# Patient Record
Sex: Female | Born: 1955
Health system: Southern US, Community
[De-identification: ages and names within clinical notes are randomized; demographics above are authoritative.]

## PROBLEM LIST (undated history)

## (undated) DIAGNOSIS — Z8719 Personal history of other diseases of the digestive system: Secondary | ICD-10-CM

## (undated) DIAGNOSIS — K219 Gastro-esophageal reflux disease without esophagitis: Secondary | ICD-10-CM

## (undated) DIAGNOSIS — Z9009 Acquired absence of other part of head and neck: Secondary | ICD-10-CM

## (undated) HISTORY — PX: BREAST ENHANCEMENT SURGERY: SHX7

## (undated) HISTORY — PX: ABDOMINAL HYSTERECTOMY: SHX81

## (undated) HISTORY — DX: Personal history of other diseases of the digestive system: Z87.19

## (undated) HISTORY — DX: Gastro-esophageal reflux disease without esophagitis: K21.9

---
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
## 1998-03-18 ENCOUNTER — Other Ambulatory Visit: Admission: RE | Admit: 1998-03-18 | Discharge: 1998-03-18 | Payer: Self-pay | Admitting: *Deleted

## 2000-04-27 ENCOUNTER — Encounter: Payer: Self-pay | Admitting: Urology

## 2000-04-27 ENCOUNTER — Encounter: Admission: RE | Admit: 2000-04-27 | Discharge: 2000-04-27 | Payer: Self-pay | Admitting: Urology

## 2000-06-23 ENCOUNTER — Ambulatory Visit (HOSPITAL_COMMUNITY): Admission: RE | Admit: 2000-06-23 | Discharge: 2000-06-23 | Payer: Self-pay | Admitting: Urology

## 2000-07-01 ENCOUNTER — Encounter: Payer: Self-pay | Admitting: Neurology

## 2000-07-01 ENCOUNTER — Ambulatory Visit (HOSPITAL_COMMUNITY): Admission: RE | Admit: 2000-07-01 | Discharge: 2000-07-01 | Payer: Self-pay | Admitting: Neurology

## 2001-04-11 HISTORY — PX: INTERSTIM IMPLANT PLACEMENT: SHX5130

## 2001-08-01 ENCOUNTER — Encounter: Admission: RE | Admit: 2001-08-01 | Discharge: 2001-08-01 | Payer: Self-pay | Admitting: Family Medicine

## 2001-08-01 ENCOUNTER — Encounter: Payer: Self-pay | Admitting: Family Medicine

## 2002-05-27 ENCOUNTER — Encounter (INDEPENDENT_AMBULATORY_CARE_PROVIDER_SITE_OTHER): Payer: Self-pay | Admitting: *Deleted

## 2002-05-27 ENCOUNTER — Ambulatory Visit (HOSPITAL_COMMUNITY): Admission: RE | Admit: 2002-05-27 | Discharge: 2002-05-27 | Payer: Self-pay | Admitting: Gastroenterology

## 2002-08-23 ENCOUNTER — Encounter: Admission: RE | Admit: 2002-08-23 | Discharge: 2002-08-23 | Payer: Self-pay | Admitting: Family Medicine

## 2002-08-23 ENCOUNTER — Encounter: Payer: Self-pay | Admitting: Family Medicine

## 2003-06-25 ENCOUNTER — Other Ambulatory Visit: Admission: RE | Admit: 2003-06-25 | Discharge: 2003-06-25 | Payer: Self-pay | Admitting: Family Medicine

## 2003-11-26 ENCOUNTER — Ambulatory Visit (HOSPITAL_BASED_OUTPATIENT_CLINIC_OR_DEPARTMENT_OTHER): Admission: RE | Admit: 2003-11-26 | Discharge: 2003-11-26 | Payer: Self-pay | Admitting: Family Medicine

## 2004-11-16 ENCOUNTER — Other Ambulatory Visit: Admission: RE | Admit: 2004-11-16 | Discharge: 2004-11-16 | Payer: Self-pay | Admitting: Family Medicine

## 2005-10-28 ENCOUNTER — Encounter: Admission: RE | Admit: 2005-10-28 | Discharge: 2005-10-28 | Payer: Self-pay | Admitting: Family Medicine

## 2005-11-03 ENCOUNTER — Encounter: Admission: RE | Admit: 2005-11-03 | Discharge: 2005-11-03 | Payer: Self-pay | Admitting: Family Medicine

## 2005-11-21 ENCOUNTER — Other Ambulatory Visit: Admission: RE | Admit: 2005-11-21 | Discharge: 2005-11-21 | Payer: Self-pay | Admitting: Family Medicine

## 2006-12-07 ENCOUNTER — Other Ambulatory Visit: Admission: RE | Admit: 2006-12-07 | Discharge: 2006-12-07 | Payer: Self-pay | Admitting: Family Medicine

## 2007-04-12 DIAGNOSIS — E892 Postprocedural hypoparathyroidism: Secondary | ICD-10-CM

## 2007-04-12 HISTORY — DX: Postprocedural hypoparathyroidism: E89.2

## 2007-04-12 HISTORY — PX: PARATHYROIDECTOMY: SHX19

## 2007-10-06 ENCOUNTER — Inpatient Hospital Stay (HOSPITAL_COMMUNITY): Admission: EM | Admit: 2007-10-06 | Discharge: 2007-10-10 | Payer: Self-pay | Admitting: Emergency Medicine

## 2007-10-06 ENCOUNTER — Ambulatory Visit: Payer: Self-pay | Admitting: Internal Medicine

## 2007-10-25 ENCOUNTER — Encounter: Admission: RE | Admit: 2007-10-25 | Discharge: 2007-10-25 | Payer: Self-pay | Admitting: Family Medicine

## 2007-12-12 ENCOUNTER — Other Ambulatory Visit: Admission: RE | Admit: 2007-12-12 | Discharge: 2007-12-12 | Payer: Self-pay | Admitting: Family Medicine

## 2008-01-08 ENCOUNTER — Encounter (INDEPENDENT_AMBULATORY_CARE_PROVIDER_SITE_OTHER): Payer: Self-pay | Admitting: General Surgery

## 2008-01-08 ENCOUNTER — Ambulatory Visit (HOSPITAL_COMMUNITY): Admission: RE | Admit: 2008-01-08 | Discharge: 2008-01-09 | Payer: Self-pay | Admitting: General Surgery

## 2008-12-31 ENCOUNTER — Other Ambulatory Visit: Admission: RE | Admit: 2008-12-31 | Discharge: 2008-12-31 | Payer: Self-pay | Admitting: Family Medicine

## 2009-11-13 ENCOUNTER — Ambulatory Visit (HOSPITAL_BASED_OUTPATIENT_CLINIC_OR_DEPARTMENT_OTHER): Admission: RE | Admit: 2009-11-13 | Discharge: 2009-11-13 | Payer: Self-pay | Admitting: Urology

## 2010-02-15 ENCOUNTER — Other Ambulatory Visit: Admission: RE | Admit: 2010-02-15 | Discharge: 2010-02-15 | Payer: Self-pay | Admitting: Family Medicine

## 2010-05-03 ENCOUNTER — Encounter: Payer: Self-pay | Admitting: General Surgery

## 2010-06-25 LAB — POCT I-STAT 4, (NA,K, GLUC, HGB,HCT)
Glucose, Bld: 98 mg/dL (ref 70–99)
HCT: 42 % (ref 36.0–46.0)
Hemoglobin: 14.3 g/dL (ref 12.0–15.0)
Potassium: 4.3 mEq/L (ref 3.5–5.1)
Sodium: 141 mEq/L (ref 135–145)

## 2010-08-24 NOTE — H&P (Signed)
NAMETAHLOR, BERENGUER NO.:  [id number]   MEDICAL RECORD NO.:  [id number]          PATIENT TYPE:  EMS   LOCATION:  MAJO                         FACILITY:  [facility]   PHYSICIAN:  Donalynn Furlong, MD      DATE OF BIRTH:  07-29-1955   DATE OF ADMISSION:  10/05/2007  DATE OF DISCHARGE:                              HISTORY & PHYSICAL   PRIMARY CARE Mathea Frieling:  Donia Guiles, M.D. at [facility].   CHIEF COMPLAINT:  Abdominal pain.   HISTORY OF PRESENT ILLNESS:  Ms. Dore is a 55 year old lady who  lives with her husband in [address] [place].  She  presented to the ER tonight with her complaint of severe abdominal pain  which was located bilaterally in upper part of abdomen and also in right  lower quadrant.  She mentioned that abdominal pain started  intermittently around 2-3 days ago and it has persisted since last 3  days.  She had episodes of nausea over the last few days, but she  started having a couple of vomiting after eating fish sandwich today.  The pain got worse today which scared her and she thought she was having  a heart attack--that is why she came to the ER tonight.  She denied any  constipation, diarrhea, blood in the stool, black color stools.  She has  history of gastroesophageal reflux disease, but her abdominal pain does  not feel like heartburn.  It feels like sharp pain in the upper abdomen.  She denied any chest pain, shortness of breath, cough, sputum  production, headache, fever, urinary complaints, leg edema at this time.  She did mention she had some chills at home over the last 3 days.  She  denied any exotic food intake and nobody is sick around her.  She denied  any history of cholecystitis, appendicitis or cardiac issues in the  past.   PAST MEDICAL HISTORY:  1. Gastroesophageal reflux disease.  2. Interstitial cystitis.  3. History of hysterectomy.   ALLERGIES:  The patient mentions that PENICILLIN gives her  some nausea,  vomiting, but she has tolerated Augmentin and Keflex in the past.   HOME MEDICATIONS:  1. Aleve.  2. Protonix.   REVIEW OF SYSTEMS:  Positive as per HPI, otherwise negative review of  systems done for 14 systems.   FAMILY HISTORY:  Nothing remarkable for cancer or coronary artery  disease.   SOCIAL HISTORY:  She drinks 2 beers a day and occasionally mixed drinks.  She does not smoke.  She does not have a past history of smoking in the  past.  She never used any illicit drugs either.  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  She lives with her husband.   PHYSICAL EXAMINATION:  VITAL SIGNS:  Blood pressure 119/74, pulse 65,  respiration 18, temperature 98.3, oxygen saturation 98% on room air.  GENERAL:  Alert and oriented x3, lying in bed not in acute distress.  CARDIOVASCULAR:  S1 and S2, regular.  No murmur, rub or gallop.  LUNGS:  Clear to auscultation bilaterally.  No  wheezing or crackles.  ABDOMEN:  Tenderness over the right upper and lower quadrant and  epigastric region.  Tenderness is also located over the left upper  quadrant.  No rebound or rigidity noted.  No bilateral flank tenderness.  No distention.  No hepatosplenomegaly on exam.  RECTAL:  Stool occult was negative as per the ER physician.  EXTREMITIES:  No clubbing, cyanosis, edema.  Pulses 5/5 in all 4  extremities.  HEENT:  Head is normocephalic nontraumatic.  Eyes:  Pupils equal and  reactive to light and movement.  Oral cavity with clear mucus membranes,  no thrush noted.  NECK:  No thyromegaly or JVD.  SKIN:  No rash or bruits.  NEUROLOGIC:  Shows intact cranial nerves, muscular strength, sensation,  deep tendon reflexes.   LABORATORY DATA:  Lipase 117.  Stool occult blood negative.  Comprehensive metabolic panel unremarkable except glucose 100 and  calcium 14.1.  First set of cardiac enzymes normal.  We will check CBC  now also.   DIAGNOSTICS:  Abdominal x-ray shows no acute  abnormalities.   ASSESSMENT:  1. Nausea, vomiting, abdominal pain suspicious for pancreatitis,      appendicitis, cholecystitis versus gastroesophageal reflux disease.  2. History of gastroesophageal reflux disease.  3. History of interstitial cystitis.   PLAN:  We will admit the patient to [facility] Team on telemetry bed with  a diagnosis of abdominal pain.  The patient is full code.  We will check  CBC, CMP, magnesium phosphate in the morning.  We will check CBC now.  We will check CT abdomen/pelvis with and without contrast now for  abdominal pain.  We will start IV fluid normal saline at 75 cc/hour.  We  will keep her n.p.o. except waters and ice chips.  We will place SCDs on  the legs.  We will start her on IV Nexium 40 mg daily for prophylaxis.  We will check cardiac enzymes with troponin q.6 h. for 2 more sets.  Further plan according to the workup pending.      Donalynn Furlong, MD  Electronically Signed     TVP/MEDQ  D:  10/06/2007  T:  10/06/2007  Job:  [id number]   cc:   Donia Guiles, M.D.  Kela Millin, M.D.

## 2010-08-24 NOTE — Consult Note (Signed)
[name], [name] NO.:  [id number]   MEDICAL RECORD NO.:  [id number]          PATIENT TYPE:  INP   LOCATION:  4707                         FACILITY:  [facility]   PHYSICIAN:  Katherine Sportsman, MD     DATE OF BIRTH:  Aug 20, 1955   DATE OF CONSULTATION:  DATE OF DISCHARGE:                                 CONSULTATION   PRIMARY CARE PHYSICIAN:  Katherine Amen, MD.   REQUESTING PHYSICIAN:  Dr. Allena Jennings with [facility].   SURGEON:  Katherine Sportsman, MD.   REASON FOR CONSULTATION:  Upper abdominal pain, possible perforated  ulcer versus pancreatitis.   HISTORY OF PRESENT ILLNESS:  Katherine Jennings is a 55 year old female with  known gastroesophageal reflux disease that seemed relatively mild and  well controlled by PPIs.  She takes them on a daily basis, and sometimes  doubles them up.  She usually has discomfort to spicy and heavier rich  foods and tends to avoid that.   She notes after having a fish sandwich, which commonly has, she began to  have upper abdominal pain.  It seemed to be a little more right sided  than the left.  She became nauseated and threw up some bilious emesis,  without definite hematemesis.  She has also had noted that her bowel  movements have been a little more black and dark, but no frank  hematochezia.  They are not tarry or with coffee ground.  The patient  returned to the emergency room.  She had slightly elevated lipase, which  was concerned for pancreatitis, and therefore, the patient was admitted  to medicine for monitoring.  To Dr. Eliane Jennings examination, her pain seemed  to be more intense than pancreatitis, so he did an abdominal scan.  The  read noted some inflammation, but they could not rule out a contained  perforation.  Therefore, Dr. Allena Jennings requested surgical consultation to  evaluate.   The patient does drink a couple of beers at night, but she does not have  any abdominal discomfort with this and does note that she tends to  urinate more often than not.  She had a screening colonoscopy last year,  that was completely negative.  She denies any history of any prior  gastric ulcers.  She tells me she has an EGD done recently which showed  evidence of some reflux disease (? esophagitis), but no major ulcers or  masses that she can recall.   PAST MEDICAL HISTORY:  1. Gastroesophageal reflux disease.  2. Uterine dysplasia.  3. Interstitial cystitis for which she is followed by Katherine      Jennings.   PAST SURGICAL HISTORY:  She had a partial hysterectomy in the past.   ALLERGIES:  She is allergic to SULFA, PENICILLIN, and ERYTHROMYCIN.   MEDICATIONS:  1. She takes multivitamin with calcium and vitamin D daily.  2. She does take 600 calcium several times a day.  3. She takes Protonix 40 mg 1-2 times a day.  4. She takes Aleve for pain as well.   SOCIAL HISTORY:  Katherine Jennings drinks about 2 beers a day.  No tobacco or  other drug use.   FAMILY HISTORY:  Negative for any major pelvic or pancreatic disorders  that she can recall.   REVIEW OF SYSTEMS:  As per HPI.  Otherwise, not constitutional.  Ophthalmologic, ENT, cardiac, respiratory, hepatic, renal, and  endocrine, otherwise negative.  Musculoskeletal, neurologic, psych,  heme, and immunologic otherwise negative.  GYN as noted above.  No  recent vaginal bleeding or discharge.  No lower abdominal pain.  GU, she  has evidence of cystitis, but she does have an implant to help regulate.  She is followed by Katherine Jennings for this.  No major hematuria or  recent changes.   PHYSICAL EXAMINATION:  VITAL SIGNS:  Her temperature is 98.3, pulse is  65, respirations 18, blood pressure 119/74.  She has 98% sats on room  air.  GENERAL:  She is a well-developed, well-nourished, overweight female,  resting in no acute distress.  She is easily awoken, calm and relaxed.  PSYCH:  She has the least average intelligence.  No evidence of any  dementia, delirium,  psychosis, or paranoia.  EYES:  Pupils equal round and reactive to light.  Extraocular movements  are intact.  Sclerae are not icteric or injected.  She does wear  glasses.  NECK:  Supple without any masses.  I can feel her thyroid, and seems to  be in the normal range.  Trachea is midline.  HEENT:  She is normocephalic.  There is no facial asymmetry.  Mucous  membranes are moist.  Nasopharynx and oropharynx are clear.  CHEST:  Clear to auscultation bilaterally.  No wheezes, rales, or  rhonchi.  No pain to rib or sternal compression.  HEART:  Regular rate and rhythm.  No murmurs, clicks, or rubs.  LYMPH:  No neck, axillary, or groin lymphadenopathy.  ABDOMEN:  They are soft, slightly overweight but nondistended.  She has  mild abdominal discomfort to pain in her right upper quadrant epigastric  region, but no classic Murphy sign.  Her lower abdomen and left upper  quadrant seemed completely nontender to me.  She did not have any  evidence of any peritoneal signs to cough or bed shake or percussion.  GENITALIA:  Normal external female genitalia.  RECTAL:  Deferred given evaluation in the ER.  EXTREMITIES:  No clubbing, cyanosis, or edema.  MUSCULOSKELETAL:  Full range of motion in shoulders, elbows, wrists, as  well as hips, knees, and ankles.  BACK:  No pain on the cervical, thoracolumbosacral spine.  GAIT:  She does seem to have normal gait.   LABORATORY DATA:  Her lipase was 117.  Her liver function test and  electrolytes were normal.  Fecal occult blood is negative x1.  Hemoglobin is 11.1, white count 9.9.   Her calcium is 14.1.   CT scans of abdomen and pelvis are reviewed with radiology.  It does  show some stranding around the second and third parts of the duodenum  and head of the pancreas concerning for possibility of pancreatitis  versus duodenitis.  He does have a little thickening.  It is possible  that she could have an ulcer.  There is no doubts or fear to argue for  a  perforation or contained perforation in my mind, but I suppose that  could be a possibility at some point.  Her gallbladder looks normal.  Her liver, spleen, kidneys, and other solid organs look fine.  There is  no hydronephrosis.  No bowel obstruction.  There is no incisional  hernia.   ASSESSMENT AND PLAN:  A 55 year old female with paroxysms of abdominal  pain of uncertain etiology, most likely pancreatitis versus duodenitis.  1. I agree with hospital admission.  2. IV fluids and I am going to give her one more aggressive fluid      resuscitation right now.  3. Start on IV ciprofloxacin and Flagyl in case she does have evidence      of contained perforation, although I am little skeptical at this      time.  If her white count does not go up or she does not      deteriorate, we can follow her off the antibiotics.  4. Keep n.p.o. for now.  5. Try and obtain copies of EGD and colonoscopy done in the past.  6. I doubled her Protonix to b.i.d. for now.  7. Hypercalcemia of uncertain etiology.  That seems little bit higher      than just from oral ingestion alone.  I did not feel any palpable      mass in the thyroid, but if that persists, perhaps an ultrasound of      the neck is warranted to make sure that this not a primary etiology      since the calcium level of 14 is significantly high.  I would      definitely repeat labs to follow and then make sure that is      consistently high.  If it is, then she needs to stop her calcium      supplementation at this time, and perhaps continue workup with      urine, calcium, and phosphate, etc.  8. Probably abstain from alcohol ingestion for sometime until we can      sort out what was going on.  9. Ultrasound to make sure that she has not any gallstones that can be      contributing to her pancreatitis, although there is no strong      evidence at this time.  10.There is nothing surgically urgent at this time, so we will follow       expectantly.      Katherine Sportsman, MD  Electronically Signed     SCG/MEDQ  D:  10/06/2007  T:  10/06/2007  Job:  [id number]

## 2010-08-24 NOTE — Discharge Summary (Signed)
NAMENOAH, Katherine Jennings             ACCOUNT NO.:  [id number]   MEDICAL RECORD NO.:  [id number]          PATIENT TYPE:  INP   LOCATION:  3019                         FACILITY:  [facility]   PHYSICIAN:  Corinna L. Lendell Caprice, MDDATE OF BIRTH:  Feb 06, 1956   DATE OF ADMISSION:  10/05/2007  DATE OF DISCHARGE:  10/10/2007                               DISCHARGE SUMMARY   DISCHARGE DIAGNOSES:  1. Pancreatitis and duodenitis.  2. Hypercalcemia, intact PTH pending.  3. Gastroesophageal reflux disease.  4. Interstitial cystitis.  5. Hypokalemia.   DISCHARGE MEDICATIONS:  1. Dilaudid 2 mg every 4 hours as needed for pain.  2. Phenergan 12.5 to 25 mg p.o. q.6 h. p.r.n. nausea.  3. Stop calcium.  4. Continue Aleve as needed.  5. Protonix 40 mg a day.  6. Iron t.i.d.   CONDITION:  Stable.   CONSULTATION:  Ardeth Sportsman, MD   DIET:  Should be low fat for 2 weeks.  Avoid alcohol.   FOLLOWUP:  Follow up with Dr. Donia Guiles, at which time her intact  PTH results will need to be checked and a repeat calcium levels.  Follow  up with Dr. Carolynne Edouard in 2-3 weeks.   PROCEDURES:  None.   LABORATORY DATA:  Intact PTH is pending.  Ionized calcium on October 07, 2007, was 1.85, which is elevated.  CBC on admission showed a normal  white count, hemoglobin was 11, and hematocrit 31, otherwise  unremarkable.  Hemoccult of the stool was negative.  Basic metabolic  panel on admission was unremarkable, but her potassium dropped to 3.2  and was repleted.  Liver function tests unremarkable.  Lipase 117, total  protein 5.5, and albumin 3.5, otherwise normal LFTs.  Initial phosphorus  was 1.6 and after repletion was 3.0 and magnesium was normal.  At  discharge, her amylase and lipase were normal.  Triglyceride level 122.  Serial cardiac enzymes negative.   SPECIAL STUDIES/RADIOLOGY:  Acute abdominal series was unremarkable.  CT  of the abdomen and pelvis with contrast showed inflammatory change  around the second  and third portions of the duodenum and pancreatic head  with mild duodenal wall thickening.  No free air.  Consider duodenitis  and pancreatitis.  Ultrasound of the abdomen showed ill-defined  hypoechoic area in the pancreatic head and uncinate process, mildly  prominent pancreatic duct measuring 2.4-mm gallbladder sludge.   HISTORY AND HOSPITAL COURSE:  [name] is a pleasant 55 year old  white female who takes calcium and vitamin D.  She has a history of  gastroesophageal reflux disease.  She drinks about 2-3 drinks a day.  She presented to the emergency room with complaints of upper abdominal  pain for several days and nausea.  She also had vomiting the day of  admission.  She was afebrile and had normal vital signs and tenderness  in the upper abdomen without rebound or rigidity.  CAT scan and  ultrasound and labs were abnormal as above.  Surgery was consulted and  felt that this was not a perforated duodenal ulcer, but more likely  duodenitis/pancreatitis.  The patient was started on  empiric antibiotics  on admission and kept NPO.  She was noted to be hypercalcemic.  She does  take calcium and vitamin D.  An intact PTH is pending and will need to  be followed up as an outpatient.  The patient's symptoms improved.  Her  diet was advanced and she has been cleared by Surgery for discharge.      Corinna L. Lendell Caprice, MD  Electronically Signed     CLS/MEDQ  D:  12/05/2007  T:  12/06/2007  Job:  [id number]

## 2010-08-24 NOTE — Op Note (Signed)
Katherine Jennings, Katherine Jennings             ACCOUNT NO.:  [id number]   MEDICAL RECORD NO.:  [id number]          PATIENT TYPE:  OIB   LOCATION:  5120                         FACILITY:  [facility]   PHYSICIAN:  Ollen Gross. Vernell Morgans, M.D. DATE OF BIRTH:  1955-10-17   DATE OF PROCEDURE:  01/08/2008  DATE OF DISCHARGE:                               OPERATIVE REPORT   PREOPERATIVE DIAGNOSIS:  Right parathyroid adenoma.   POSTOPERATIVE DIAGNOSIS:  Right parathyroid adenoma x2.   PROCEDURE:  Right parathyroidectomy x2.   SURGEON:  Ollen Gross. Vernell Morgans, MD   ASSISTANT:  Velora Heckler, MD   ANESTHESIA:  General endotracheal.   PROCEDURE:  After informed consent was obtained, the patient was brought  to the operating room and placed in the supine position on the operating  table.  After adequate induction of general anesthesia, the patient's  neck was prepped with Betadine and draped in the usual sterile manner.  The patient had hypercalcemia and hyperparathyroidism.  She had a  sestamibi scan that localized the adenoma to the right side.  She could  not undergone an MRI study to determine whether this was superior-  inferior intrathyroidal.  A small transversely oriented incision was  made about 2 fingerbreadths above the clavicle on the right side of the  neck.  The patient had undergone earlier in the day a nuclear medicine  injection for sestamibi.  The patient seemed to have diffuse uptake  higher on the right side than on the left side.  It seemed to be a  little bit higher inferiorly than superiorly.  The midline was  identified and dissection was carried down sharply.  Once the incision  was made with 15 blade knife, the incision was carried down through the  skin, subcutaneous tissue, and platysma muscle sharply with  electrocautery.  Weitlaner retractor was deployed.  The midline was  identified and dissection was carried sharply with electrocautery down  the midline.  Until the thyroid gland was  identified, the strap muscles  were retracted laterally.  The lateral border of the thyroid gland was  gently rolled medially and anteriorly with a Kitner.  Blunt dissection  was carried out behind the thyroid gland.  Initially, there appeared to  be an abnormality inferiorly.  We had dissected the parathyroid gland  out by a combination of blunt right angle dissection and some sharp  dissection with electrocautery.  A couple of small vessels were  controlled with small vascular clips.  This inferior parathyroid gland  was enlarged consistent with an adenoma.  It was sent to pathology where  parathyroid adenoma was confirmed.  On further inspection, the superior  gland was also identified and after careful inspection also appeared to  be abnormally enlarged.  The superior parathyroid gland was also removed  by a combination of blunt right angle dissection and some sharp  dissection with electrocautery and controlled some small vessels with  small vascular clips.  Once this was removed, it was also sent to  pathology where parathyroid adenoma was confirmed.  There were no other  visible or palpable abnormalities on  the right side.  The area was  completely hemostatic.  The operative bed was irrigated and then lined  with some  Surgicel.  The strap muscles were then reapproximated with  interrupted 4-0 Vicryl stitches and the platysma muscle also was  reapproximated with interrupted 4-0 Vicryl stitches.  The skin was  then closed with running 4-0 Monocryl subcuticular stitch and a  Dermabond dressing was applied.  The patient tolerated the procedure  well.  At the end of the case, all needle, sponge, and instrument counts  were correct.  The patient was awakened and taken to the recovery room  in stable condition.      Ollen Gross. Vernell Morgans, M.D.  Electronically Signed     PST/MEDQ  D:  01/08/2008  T:  01/09/2008  Job:  [id number]

## 2010-08-27 NOTE — Op Note (Signed)
[facility]  Patient:    Katherine Jennings, Katherine Jennings                    MRN: [id number] Proc. Date: 06/23/00 Adm. Date:  [id number] Attending:  Nelma Rothman Iii                           Operative Report  PROCEDURES:  Cystourethroscopy, hydraulic bladder distention, bladder biopsy, and examination under anesthesia.  SURGEON:  Lucrezia Starch. Ovidio Hanger, M.D.  ANESTHESIA:  General laryngeal airway.  ESTIMATED BLOOD LOSS:  Negligible.  TUBES:  None.  COMPLICATIONS:  None.  INDICATION FOR PROCEDURE:  [name] is a lovely 55 year old white female who presented with severe urgency and frequency, which has been progressive. She has undergone urodynamic evaluation, which revealed several low-amplitude uninhibited bladder contractions with provocation.  She has been treated with anticholinergics and it has helped some, but the situation is still persistent.  She again has a worsening situation with leakage, primarily urge incontinence, has undergone a workup by Genene Churn. Love, M.D., neurologically, and is completing that workup, but we had some concerns of interstitial cystitis as certainly a possibility.  After examining the risks, benefits, and alternatives, she has elected to proceed with the above procedure.  DESCRIPTION OF PROCEDURE:  The patient was placed in the supine position and after proper general laryngeal airway anesthesia was placed in the dorsal lithotomy position and prepped and draped with Betadine in a sterile fashion. Cystourethroscopy was performed with a 22.5 [place] [employer] panendoscope utilizing the 30 and 70 degree lenses.  The bladder was carefully inspected and noted to be without lesion.  An efflux of clear urine was noted from the normally-placed ureteral orifices bilaterally, and the urethra appeared to be essentially normal without inflammation.  Hydraulic bladder distention was then performed to 80 cm H2O.  She had a capacity of 700  cc.  This was drained. and there were diffuse glomerulations noted throughout the bladder, and pictures were taken of that with the camera, but it was clear-cut glomerulations suspicious for interstitial cystitis.  A biopsy was then obtained of the posterior midline at an area of glomerulations and submitted to pathology, and the base was cauterized with Bovie coagulation cautery.  The bladder was drained.  The panendoscope was visually removed.  Exam under anesthesia revealed a benign, palpable bladder.  No masses were noted.  Of note, she did appear to have a tiny hemangioma inside the left labia minora. The patient tolerated the procedure well, specimens submitted to pathology, and she was taken to the recovery room stable. DD:  06/23/00 TD:  06/23/00 Job: [id number]

## 2010-08-27 NOTE — Procedures (Signed)
NAME:  Katherine Jennings, Katherine Jennings           ACCOUNT NO.:  [id number]   MEDICAL RECORD NO.:  [id number]          PATIENT TYPE:  OUT   LOCATION:  SLEEP CENTER                 FACILITY:  [facility]   PHYSICIAN:  Clinton D. Maple Hudson, M.D. DATE OF BIRTH:  11-17-1955   DATE OF ADMISSION:  11/26/2003  DATE OF DISCHARGE:  11/26/2003                              NOCTURNAL POLYSOMNOGRAM   REFERRING PHYSICIAN:  Dr. Donia Guiles   INDICATION FOR STUDY:  Hypersomnia with sleep apnea, complaints of loud  snoring, witnessed apnea, frequent waking after sleep onset.   EPWORTH SLEEPINESS SCORE:  6/24   BODY MASS INDEX:  26.6   WEIGHT:  170 pounds   MEDICATION:  Elmiron, black cohosh, Caltrate, hydroxyzine, Zoloft, Aleve,  Prilosec, Atarax.  Patient took Zoloft and hydroxyzine prior to bedtime on  study night.   SLEEP ARCHITECTURE:  Total sleep time 357 minutes with sleep efficiency 84%.  Stage I was 9%, Stage II 62%, Stages III and IV 8%, REM was 21% of total  sleep time, latency to sleep onset 22 minutes, latency to REM 186 minutes.  Awake after sleep onset 47 minutes.  Arousal index 14/hr.   RESPIRATORY DATA:  A total of six obstructive hypopneas and two obstructive  apneas were recorded for a respiratory index of 1.3/hr which is within  normal limits.  She did not qualify for split-study protocol.  Events were  primarily associated with supine sleep position.  REM RDI was 0.8/hr.   OXYGEN DATA:  Moderate snoring with normal oxygen saturation.  Mean oxygen  saturation through the study was 95% with nadir briefly recorded at 89%.   CARDIAC DATA:  Normal cardiac rhythm with no significant ectopic events.   MOVEMENT/PARASOMNIA:  A total of 37 limb jerks were recorded of which 5 were  associated with arousal or awakening for a periodic limb movement with  arousal index of 0.8/hr which is within normal limits.  Bruxism was  questioned.   IMPRESSION/RECOMMENDATION:  Occasional obstructive apneas and  hypopneas,  within normal limits with an respiratory disturbance index of 1.3/hr and  normal oxygenation.  Events appeared to be positional, most frequent while  she was sleeping supine.  Avoiding this sleep position may be of some  benefit.                                   ______________________________                                Rennis Chris. Maple Hudson, M.D.                                Diplomate, American Board of Sleep Medicine    CDY/MEDQ  D:  12/07/2003 09:00:36  T:  12/08/2003 08:37:55  Job:  [id number]

## 2011-01-06 LAB — CBC
HCT: 31 — ABNORMAL LOW
HCT: 31.2 — ABNORMAL LOW
HCT: 31.1 — ABNORMAL LOW
Hemoglobin: 10.9 — ABNORMAL LOW
Hemoglobin: 11.1 — ABNORMAL LOW
Hemoglobin: 10.9 — ABNORMAL LOW
MCHC: 34.8
MCHC: 35.9
MCHC: 34.9
MCV: 97
MCV: 97.5
MCV: 98.5
Platelets: 287
Platelets: 295
Platelets: 306
RBC: 3.2 — ABNORMAL LOW
RBC: 3.2 — ABNORMAL LOW
RBC: 3.16 — ABNORMAL LOW
RDW: 12.6
RDW: 12.6
RDW: 12.6
WBC: 9.2
WBC: 9.9
WBC: 6.7

## 2011-01-06 LAB — BASIC METABOLIC PANEL
BUN: 6
BUN: 6
BUN: 6
CO2: 25
CO2: 25
CO2: 24
Calcium: 11.2 — ABNORMAL HIGH
Calcium: 11.5 — ABNORMAL HIGH
Calcium: 11.7 — ABNORMAL HIGH
Chloride: 108
Chloride: 114 — ABNORMAL HIGH
Chloride: 112
Creatinine, Ser: 0.85
Creatinine, Ser: 0.87
Creatinine, Ser: 0.86
GFR calc Af Amer: >60
GFR calc Af Amer: >60
GFR calc Af Amer: >60
GFR calc non Af Amer: >60
GFR calc non Af Amer: >60
GFR calc non Af Amer: >60
Glucose, Bld: 86
Glucose, Bld: 91
Glucose, Bld: 98
Potassium: 3.2 — ABNORMAL LOW
Potassium: 3.2 — ABNORMAL LOW
Potassium: 3.8
Sodium: 140
Sodium: 141
Sodium: 141

## 2011-01-06 LAB — CARDIAC PANEL(CRET KIN+CKTOT+MB+TROPI)
CK, MB: 1
Relative Index: INVALID
Total CK: 44
Troponin I: 0.01

## 2011-01-06 LAB — COMPREHENSIVE METABOLIC PANEL
ALT: 22
ALT: 26
AST: 15
AST: 22
Albumin: 3.5
Albumin: 4.3
Alkaline Phosphatase: 69
Alkaline Phosphatase: 86
BUN: 11
BUN: 19
CO2: 24
CO2: 26
Calcium: 11.7 — ABNORMAL HIGH
Calcium: 14.1
Chloride: 111
Chloride: 111
Creatinine, Ser: 0.89
Creatinine, Ser: 1.05
GFR calc Af Amer: >60
GFR calc Af Amer: >60
GFR calc non Af Amer: 55 — ABNORMAL LOW
GFR calc non Af Amer: >60
Glucose, Bld: 100 — ABNORMAL HIGH
Glucose, Bld: 98
Potassium: 3.9
Potassium: 4.3
Sodium: 140
Sodium: 142
Total Bilirubin: 0.9
Total Bilirubin: 1
Total Protein: 5.5 — ABNORMAL LOW
Total Protein: 6.7

## 2011-01-06 LAB — LIPASE, BLOOD
Lipase: 117 — ABNORMAL HIGH
Lipase: 39
Lipase: 44
Lipase: 68 — ABNORMAL HIGH

## 2011-01-06 LAB — POCT CARDIAC MARKERS
CKMB, poc: <1 — ABNORMAL LOW
Myoglobin, poc: 62.1
Operator id: 192351
Troponin i, poc: <0.05

## 2011-01-06 LAB — PHOSPHORUS
Phosphorus: 1.5 — ABNORMAL LOW
Phosphorus: 1.6 — ABNORMAL LOW
Phosphorus: 3

## 2011-01-06 LAB — OCCULT BLOOD X 1 CARD TO LAB, STOOL: Fecal Occult Bld: NEGATIVE

## 2011-01-06 LAB — AMYLASE
Amylase: 106
Amylase: 38
Amylase: 45

## 2011-01-06 LAB — CALCIUM, IONIZED: Calcium, Ion: 1.85 — ABNORMAL HIGH

## 2011-01-06 LAB — TROPONIN I: Troponin I: <0.01

## 2011-01-06 LAB — CK TOTAL AND CKMB (NOT AT ARMC)
CK, MB: 0.8
Relative Index: INVALID
Total CK: 44

## 2011-01-06 LAB — PTH, INTACT AND CALCIUM
Calcium, Total (PTH): 12.3 — ABNORMAL HIGH
PTH: 286 — ABNORMAL HIGH

## 2011-01-06 LAB — MAGNESIUM: Magnesium: 2

## 2011-01-06 LAB — TRIGLYCERIDES: Triglycerides: 122

## 2011-01-10 LAB — CBC
HCT: 37.4
Hemoglobin: 12.8
MCHC: 34.1
MCV: 98.9
Platelets: 381
RBC: 3.79 — ABNORMAL LOW
RDW: 12.4
WBC: 8

## 2011-01-10 LAB — BASIC METABOLIC PANEL
BUN: 19
CO2: 31
Calcium: 12.9 — ABNORMAL HIGH
Chloride: 106
Creatinine, Ser: 0.96
GFR calc Af Amer: >60
GFR calc non Af Amer: >60
Glucose, Bld: 90
Potassium: 4.2
Sodium: 140

## 2011-01-10 LAB — CALCIUM
Calcium: 10.8 — ABNORMAL HIGH
Calcium: 12 — ABNORMAL HIGH

## 2015-08-10 DIAGNOSIS — Z1231 Encounter for screening mammogram for malignant neoplasm of breast: Secondary | ICD-10-CM | POA: Diagnosis not present

## 2015-10-16 DIAGNOSIS — R350 Frequency of micturition: Secondary | ICD-10-CM | POA: Diagnosis not present

## 2015-10-16 DIAGNOSIS — N3010 Interstitial cystitis (chronic) without hematuria: Secondary | ICD-10-CM | POA: Diagnosis not present

## 2015-11-06 DIAGNOSIS — N3010 Interstitial cystitis (chronic) without hematuria: Secondary | ICD-10-CM | POA: Diagnosis not present

## 2015-11-06 DIAGNOSIS — T85113A Breakdown (mechanical) of implanted electronic neurostimulator, generator, initial encounter: Secondary | ICD-10-CM | POA: Diagnosis not present

## 2015-11-06 DIAGNOSIS — Z4542 Encounter for adjustment and management of neuropacemaker (brain) (peripheral nerve) (spinal cord): Secondary | ICD-10-CM | POA: Diagnosis not present

## 2015-11-17 DIAGNOSIS — N3010 Interstitial cystitis (chronic) without hematuria: Secondary | ICD-10-CM | POA: Diagnosis not present

## 2016-05-26 DIAGNOSIS — Z0000 Encounter for general adult medical examination without abnormal findings: Secondary | ICD-10-CM | POA: Diagnosis not present

## 2016-05-26 DIAGNOSIS — M8588 Other specified disorders of bone density and structure, other site: Secondary | ICD-10-CM | POA: Diagnosis not present

## 2016-05-26 DIAGNOSIS — D351 Benign neoplasm of parathyroid gland: Secondary | ICD-10-CM | POA: Diagnosis not present

## 2016-05-26 DIAGNOSIS — N3011 Interstitial cystitis (chronic) with hematuria: Secondary | ICD-10-CM | POA: Diagnosis not present

## 2016-05-26 DIAGNOSIS — K219 Gastro-esophageal reflux disease without esophagitis: Secondary | ICD-10-CM | POA: Diagnosis not present

## 2016-08-10 DIAGNOSIS — Z1231 Encounter for screening mammogram for malignant neoplasm of breast: Secondary | ICD-10-CM | POA: Diagnosis not present

## 2016-08-11 DIAGNOSIS — R922 Inconclusive mammogram: Secondary | ICD-10-CM | POA: Diagnosis not present

## 2016-08-19 DIAGNOSIS — Z1211 Encounter for screening for malignant neoplasm of colon: Secondary | ICD-10-CM | POA: Diagnosis not present

## 2016-08-19 DIAGNOSIS — D126 Benign neoplasm of colon, unspecified: Secondary | ICD-10-CM | POA: Diagnosis not present

## 2016-08-19 DIAGNOSIS — K6389 Other specified diseases of intestine: Secondary | ICD-10-CM | POA: Diagnosis not present

## 2016-08-23 DIAGNOSIS — Z1211 Encounter for screening for malignant neoplasm of colon: Secondary | ICD-10-CM | POA: Diagnosis not present

## 2016-08-23 DIAGNOSIS — K6389 Other specified diseases of intestine: Secondary | ICD-10-CM | POA: Diagnosis not present

## 2016-08-23 DIAGNOSIS — D126 Benign neoplasm of colon, unspecified: Secondary | ICD-10-CM | POA: Diagnosis not present

## 2016-09-15 DIAGNOSIS — K635 Polyp of colon: Secondary | ICD-10-CM | POA: Diagnosis not present

## 2016-11-25 DIAGNOSIS — M719 Bursopathy, unspecified: Secondary | ICD-10-CM | POA: Diagnosis not present

## 2016-11-25 DIAGNOSIS — K219 Gastro-esophageal reflux disease without esophagitis: Secondary | ICD-10-CM | POA: Diagnosis not present

## 2016-11-28 ENCOUNTER — Other Ambulatory Visit: Payer: Self-pay | Admitting: Physician Assistant

## 2016-11-28 DIAGNOSIS — R1310 Dysphagia, unspecified: Secondary | ICD-10-CM

## 2016-11-28 DIAGNOSIS — K219 Gastro-esophageal reflux disease without esophagitis: Secondary | ICD-10-CM

## 2016-11-29 DIAGNOSIS — M25552 Pain in left hip: Secondary | ICD-10-CM | POA: Diagnosis not present

## 2016-11-29 DIAGNOSIS — M25551 Pain in right hip: Secondary | ICD-10-CM | POA: Diagnosis not present

## 2016-11-29 DIAGNOSIS — M545 Low back pain: Secondary | ICD-10-CM | POA: Diagnosis not present

## 2016-11-30 ENCOUNTER — Ambulatory Visit
Admission: RE | Admit: 2016-11-30 | Discharge: 2016-11-30 | Disposition: A | Discharge status: Home / Self Care | Payer: BLUE CROSS/BLUE SHIELD | Source: Ambulatory Visit | Attending: Physician Assistant | Admitting: Physician Assistant

## 2016-11-30 DIAGNOSIS — R1310 Dysphagia, unspecified: Secondary | ICD-10-CM

## 2016-11-30 DIAGNOSIS — K219 Gastro-esophageal reflux disease without esophagitis: Secondary | ICD-10-CM | POA: Diagnosis not present

## 2016-12-02 DIAGNOSIS — K2930 Chronic superficial gastritis without bleeding: Secondary | ICD-10-CM | POA: Diagnosis not present

## 2016-12-02 DIAGNOSIS — R1310 Dysphagia, unspecified: Secondary | ICD-10-CM | POA: Diagnosis not present

## 2016-12-02 DIAGNOSIS — R933 Abnormal findings on diagnostic imaging of other parts of digestive tract: Secondary | ICD-10-CM | POA: Diagnosis not present

## 2016-12-07 DIAGNOSIS — K2930 Chronic superficial gastritis without bleeding: Secondary | ICD-10-CM | POA: Diagnosis not present

## 2016-12-15 DIAGNOSIS — D122 Benign neoplasm of ascending colon: Secondary | ICD-10-CM | POA: Diagnosis not present

## 2016-12-28 DIAGNOSIS — K219 Gastro-esophageal reflux disease without esophagitis: Secondary | ICD-10-CM | POA: Diagnosis not present

## 2016-12-28 DIAGNOSIS — Z79899 Other long term (current) drug therapy: Secondary | ICD-10-CM | POA: Diagnosis not present

## 2016-12-28 DIAGNOSIS — Z881 Allergy status to other antibiotic agents status: Secondary | ICD-10-CM | POA: Diagnosis not present

## 2016-12-28 DIAGNOSIS — K635 Polyp of colon: Secondary | ICD-10-CM | POA: Diagnosis not present

## 2016-12-28 DIAGNOSIS — Z880 Allergy status to penicillin: Secondary | ICD-10-CM | POA: Diagnosis not present

## 2016-12-28 DIAGNOSIS — K6389 Other specified diseases of intestine: Secondary | ICD-10-CM | POA: Diagnosis not present

## 2016-12-28 DIAGNOSIS — Z86010 Personal history of colonic polyps: Secondary | ICD-10-CM | POA: Diagnosis not present

## 2016-12-28 DIAGNOSIS — Z882 Allergy status to sulfonamides status: Secondary | ICD-10-CM | POA: Diagnosis not present

## 2017-01-05 DIAGNOSIS — N3010 Interstitial cystitis (chronic) without hematuria: Secondary | ICD-10-CM | POA: Diagnosis not present

## 2017-01-12 DIAGNOSIS — K635 Polyp of colon: Secondary | ICD-10-CM | POA: Diagnosis not present

## 2017-01-12 DIAGNOSIS — K219 Gastro-esophageal reflux disease without esophagitis: Secondary | ICD-10-CM | POA: Diagnosis not present

## 2017-01-12 DIAGNOSIS — R1310 Dysphagia, unspecified: Secondary | ICD-10-CM | POA: Diagnosis not present

## 2017-06-12 DIAGNOSIS — M7712 Lateral epicondylitis, left elbow: Secondary | ICD-10-CM | POA: Diagnosis not present

## 2017-06-22 DIAGNOSIS — M6281 Muscle weakness (generalized): Secondary | ICD-10-CM | POA: Diagnosis not present

## 2017-06-22 DIAGNOSIS — M25522 Pain in left elbow: Secondary | ICD-10-CM | POA: Diagnosis not present

## 2017-06-22 DIAGNOSIS — M7712 Lateral epicondylitis, left elbow: Secondary | ICD-10-CM | POA: Diagnosis not present

## 2017-06-23 DIAGNOSIS — Z0000 Encounter for general adult medical examination without abnormal findings: Secondary | ICD-10-CM | POA: Diagnosis not present

## 2017-06-23 DIAGNOSIS — K219 Gastro-esophageal reflux disease without esophagitis: Secondary | ICD-10-CM | POA: Diagnosis not present

## 2017-06-23 DIAGNOSIS — M8588 Other specified disorders of bone density and structure, other site: Secondary | ICD-10-CM | POA: Diagnosis not present

## 2017-06-28 DIAGNOSIS — M25522 Pain in left elbow: Secondary | ICD-10-CM | POA: Diagnosis not present

## 2017-06-28 DIAGNOSIS — M6281 Muscle weakness (generalized): Secondary | ICD-10-CM | POA: Diagnosis not present

## 2017-06-28 DIAGNOSIS — M7712 Lateral epicondylitis, left elbow: Secondary | ICD-10-CM | POA: Diagnosis not present

## 2017-06-30 DIAGNOSIS — M7712 Lateral epicondylitis, left elbow: Secondary | ICD-10-CM | POA: Diagnosis not present

## 2017-06-30 DIAGNOSIS — M25522 Pain in left elbow: Secondary | ICD-10-CM | POA: Diagnosis not present

## 2017-06-30 DIAGNOSIS — M6281 Muscle weakness (generalized): Secondary | ICD-10-CM | POA: Diagnosis not present

## 2017-07-05 DIAGNOSIS — M6281 Muscle weakness (generalized): Secondary | ICD-10-CM | POA: Diagnosis not present

## 2017-07-05 DIAGNOSIS — M7712 Lateral epicondylitis, left elbow: Secondary | ICD-10-CM | POA: Diagnosis not present

## 2017-07-05 DIAGNOSIS — M25522 Pain in left elbow: Secondary | ICD-10-CM | POA: Diagnosis not present

## 2017-07-07 DIAGNOSIS — M6281 Muscle weakness (generalized): Secondary | ICD-10-CM | POA: Diagnosis not present

## 2017-07-07 DIAGNOSIS — M25522 Pain in left elbow: Secondary | ICD-10-CM | POA: Diagnosis not present

## 2017-07-07 DIAGNOSIS — M7712 Lateral epicondylitis, left elbow: Secondary | ICD-10-CM | POA: Diagnosis not present

## 2017-07-11 DIAGNOSIS — M25522 Pain in left elbow: Secondary | ICD-10-CM | POA: Diagnosis not present

## 2017-07-11 DIAGNOSIS — M6281 Muscle weakness (generalized): Secondary | ICD-10-CM | POA: Diagnosis not present

## 2017-07-11 DIAGNOSIS — M7712 Lateral epicondylitis, left elbow: Secondary | ICD-10-CM | POA: Diagnosis not present

## 2017-07-14 DIAGNOSIS — M7712 Lateral epicondylitis, left elbow: Secondary | ICD-10-CM | POA: Diagnosis not present

## 2017-07-14 DIAGNOSIS — M25522 Pain in left elbow: Secondary | ICD-10-CM | POA: Diagnosis not present

## 2017-07-14 DIAGNOSIS — M6281 Muscle weakness (generalized): Secondary | ICD-10-CM | POA: Diagnosis not present

## 2017-07-17 DIAGNOSIS — M25522 Pain in left elbow: Secondary | ICD-10-CM | POA: Diagnosis not present

## 2017-08-15 DIAGNOSIS — Z1231 Encounter for screening mammogram for malignant neoplasm of breast: Secondary | ICD-10-CM | POA: Diagnosis not present

## 2017-11-08 DIAGNOSIS — J329 Chronic sinusitis, unspecified: Secondary | ICD-10-CM | POA: Diagnosis not present

## 2018-01-09 DIAGNOSIS — N3010 Interstitial cystitis (chronic) without hematuria: Secondary | ICD-10-CM | POA: Diagnosis not present

## 2018-01-09 DIAGNOSIS — R350 Frequency of micturition: Secondary | ICD-10-CM | POA: Diagnosis not present

## 2018-01-26 DIAGNOSIS — Z23 Encounter for immunization: Secondary | ICD-10-CM | POA: Diagnosis not present

## 2018-10-09 DIAGNOSIS — Z0000 Encounter for general adult medical examination without abnormal findings: Secondary | ICD-10-CM | POA: Diagnosis not present

## 2018-10-16 DIAGNOSIS — D351 Benign neoplasm of parathyroid gland: Secondary | ICD-10-CM | POA: Diagnosis not present

## 2018-10-16 DIAGNOSIS — Z0000 Encounter for general adult medical examination without abnormal findings: Secondary | ICD-10-CM | POA: Diagnosis not present

## 2018-11-06 DIAGNOSIS — Z90710 Acquired absence of both cervix and uterus: Secondary | ICD-10-CM | POA: Diagnosis not present

## 2018-11-06 DIAGNOSIS — M8589 Other specified disorders of bone density and structure, multiple sites: Secondary | ICD-10-CM | POA: Diagnosis not present

## 2018-11-06 DIAGNOSIS — R29890 Loss of height: Secondary | ICD-10-CM | POA: Diagnosis not present

## 2018-11-06 DIAGNOSIS — Z780 Asymptomatic menopausal state: Secondary | ICD-10-CM | POA: Diagnosis not present

## 2018-11-06 DIAGNOSIS — Z1231 Encounter for screening mammogram for malignant neoplasm of breast: Secondary | ICD-10-CM | POA: Diagnosis not present

## 2018-11-15 DIAGNOSIS — N3010 Interstitial cystitis (chronic) without hematuria: Secondary | ICD-10-CM | POA: Diagnosis not present

## 2019-01-16 ENCOUNTER — Other Ambulatory Visit: Payer: Self-pay

## 2019-01-17 ENCOUNTER — Encounter: Payer: Self-pay | Admitting: Internal Medicine

## 2019-01-17 ENCOUNTER — Ambulatory Visit (INDEPENDENT_AMBULATORY_CARE_PROVIDER_SITE_OTHER): Payer: BC Managed Care – PPO | Admitting: Internal Medicine

## 2019-01-17 DIAGNOSIS — M8580 Other specified disorders of bone density and structure, unspecified site: Secondary | ICD-10-CM | POA: Diagnosis not present

## 2019-01-17 DIAGNOSIS — M859 Disorder of bone density and structure, unspecified: Secondary | ICD-10-CM

## 2019-01-17 LAB — VITAMIN D 25 HYDROXY (VIT D DEFICIENCY, FRACTURES): VITD: 48.57 ng/mL (ref 30.00–100.00)

## 2019-01-17 LAB — COMPREHENSIVE METABOLIC PANEL
ALT: 30 U/L (ref 0–35)
AST: 26 U/L (ref 0–37)
Albumin: 4.4 g/dL (ref 3.5–5.2)
Alkaline Phosphatase: 97 U/L (ref 39–117)
BUN: 15 mg/dL (ref 6–23)
CO2: 27 mEq/L (ref 19–32)
Calcium: 12 mg/dL — ABNORMAL HIGH (ref 8.4–10.5)
Chloride: 104 mEq/L (ref 96–112)
Creatinine, Ser: 0.95 mg/dL (ref 0.40–1.20)
GFR: 59.37 mL/min — ABNORMAL LOW (ref >60.00)
Glucose, Bld: 106 mg/dL — ABNORMAL HIGH (ref 70–99)
Potassium: 4.5 mEq/L (ref 3.5–5.1)
Sodium: 137 mEq/L (ref 135–145)
Total Bilirubin: 1 mg/dL (ref 0.2–1.2)
Total Protein: 7 g/dL (ref 6.0–8.3)

## 2019-01-17 LAB — MAGNESIUM: Magnesium: 2.1 mg/dL (ref 1.5–2.5)

## 2019-01-17 LAB — TSH: TSH: 1.09 u[IU]/mL (ref 0.35–4.50)

## 2019-01-17 LAB — PHOSPHORUS: Phosphorus: 1.7 mg/dL — ABNORMAL LOW (ref 2.3–4.6)

## 2019-01-17 LAB — ALBUMIN: Albumin: 4.4 g/dL (ref 3.5–5.2)

## 2019-01-17 NOTE — Patient Instructions (Signed)
-   Please stay hydrated - Avoid over the counter calcium products - Please make sure you are consuming 2-3 servings of calcium in your diet daily     24-Hour Urine Collection   You will be collecting your urine for a 24-hour period of time.  Your timer starts with your first urine of the morning (For example - If you first pee at [place], your timer will start at [place])  [place] away your first urine of the morning  Collect your urine every time you pee for the next 24 hours STOP your urine collection 24 hours after you started the collection (For example - You would stop at 9AM the day after you started)

## 2019-01-17 NOTE — Progress Notes (Signed)
Name: Katherine Jennings  MRN/ DOB: [id number], Apr 19, 1955    Age/ Sex: 63 y.o., female    PCP: Kelton Pillar, MD   Reason for Endocrinology Evaluation: Osteopenia     Date of Initial Endocrinology Evaluation: 01/18/2019     HPI: [name] is a 63 y.o. female with a past medical history of hyperparathyroidism (S/P resection of the right superior and inferior 2009), GERD,Pancreatitis secondary to hypercalcemia and interstitial nephritis  The patient presented for initial endocrinology clinic visit on 01/18/2019 for consultative assistance with her Osteopenia   Katherine Jennings indicates that she was first diagnosed with hypercalcemia many years ago. She is S/P Right superior and inferior parathyroidectomy in 2009. Post-Op her hypercalcemia improved from 12.9 to 10.8 mg/dL . No records of calcium from 2009 until 2018, when again hypercalcemia was noted in 2018. Since that time, she denies experienced symptoms of constipation, polydipsia,  Has polyuria due to interstitial cystitis, generalized weakness, diffuse muscle pains, but denies significant memory impairment. She denies use of over the counter calcium (including supplements, Tums, Rolaids, or other calcium containing antacids), lithium, HCTZ.    She in on 200 iu vitamin D daily   She denies  history of kidney stones, kidney disease, liver disease, granulomatous disease. She denies  Osteoporosis but has osteopenia for 10 yrs, has prior fractures right pelvis and sacrum fell of a horse .Right wrist fracture falling on ice.  Daily dietary calcium intake: 1 servings (almond milk ). She denies family history of osteoporosis, parathyroid disease, thyroid disease.     HISTORY:  Past Medical History:  Past Medical History:  Diagnosis Date  . GERD (gastroesophageal reflux disease)   . H/O parathyroidectomy 2009  . History of pancreatitis    Secondary to Hypercalcemia    Past Surgical History:  Past Surgical History:  Procedure  Laterality Date  . ABDOMINAL HYSTERECTOMY    . BREAST ENHANCEMENT SURGERY        Social History:  reports that she has been smoking. She has never used smokeless tobacco. She reports current alcohol use.  Family History: family history includes Diabetes in her mother; Throat cancer in her mother.   HOME MEDICATIONS: Allergies as of 01/17/2019      Reactions   Azithromycin Rash   Cephalexin Rash   Erythromycin Rash   Penicillins Rash   Sulfa Antibiotics Rash      Medication List       Accurate as of January 17, 2019 11:59 PM. If you have any questions, ask your nurse or doctor.        Elmiron 100 MG capsule Generic drug: pentosan polysulfate Take by mouth. 2 times daily   Naproxen Sodium 220 MG Caps Take by mouth.   pantoprazole 40 MG tablet Commonly known as: PROTONIX TK 1 T PO ONCE A DAY   Vitamin D3 25 MCG (1000 UT) Caps Take by mouth.         REVIEW OF SYSTEMS: A comprehensive ROS was conducted with the patient and is negative except as per HPI and below:  Review of Systems  Constitutional: Negative for chills and fever.  HENT: Negative for congestion and sore throat.   Eyes: Negative for blurred vision and double vision.  Respiratory: Negative for cough and shortness of breath.   Cardiovascular: Negative for chest pain and palpitations.  Gastrointestinal: Negative for abdominal pain and nausea.  Genitourinary: Positive for frequency.  Musculoskeletal: Positive for myalgias.  Skin: Negative.   Neurological: Negative for  tingling and tremors.  Endo/Heme/Allergies: Positive for polydipsia.  Psychiatric/Behavioral: Negative for depression. The patient is not nervous/anxious.        OBJECTIVE:  VS: BP 114/78 (BP Location: Left Arm, Patient Position: Sitting, Cuff Size: Normal)   Pulse 75   Temp 98.2 F (36.8 C)   Ht 5\' 6"  (1.676 m)   Wt 170 lb (77.1 kg)   SpO2 97%   BMI 27.44 kg/m    Wt Readings from Last 3 Encounters:  01/17/19 170 lb (77.1  kg)     EXAM: General: Pt appears well and is in NAD  Hydration: Well-hydrated with moist mucous membranes and good skin turgor  Eyes: External eye exam normal without stare, lid lag or exophthalmos.  EOM intact.    Ears, Nose, Throat: Hearing: Grossly intact bilaterally Dental: Good dentition  Throat: Clear without mass, erythema or exudate  Neck: General: Supple without adenopathy. Thyroid: Thyroid size normal.  No goiter or nodules appreciated. No thyroid bruit.  Lungs: Clear with good BS bilat with no rales, rhonchi, or wheezes  Heart: Auscultation: RRR.  Abdomen: Normoactive bowel sounds, soft, nontender, without masses or organomegaly palpable  Extremities:  BL LE: No pretibial edema normal ROM and strength.  Skin: Hair: Texture and amount normal with gender appropriate distribution Skin Inspection: No rashes, acanthosis nigricans/skin tags. No lipohypertrophy Skin Palpation: Skin temperature, texture, and thickness normal to palpation  Neuro: Cranial nerves: II - XII grossly intact  Motor: Normal strength throughout DTRs: 2+ and symmetric in UE without delay in relaxation phase  Mental Status: Judgment, insight: Intact Orientation: Oriented to time, place, and person Mood and affect: No depression, anxiety, or agitation     DATA REVIEWED: 10/16/2018  Gluc 98  BUN/Cr 19/0.99 GFR 57 Calcium (corrected) 11.10 mg/dL  TG 233 LDL 107 Results for Katherine Jennings, Katherine Jennings (MRN [id number]) as of 01/18/2019 08:28  Ref. Range 01/17/2019 08:10  Sodium Latest Ref Range: 135 - 145 mEq/L 137  Potassium Latest Ref Range: 3.5 - 5.1 mEq/L 4.5  Chloride Latest Ref Range: 96 - 112 mEq/L 104  CO2 Latest Ref Range: 19 - 32 mEq/L 27  Glucose Latest Ref Range: 70 - 99 mg/dL 106 (H)  BUN Latest Ref Range: 6 - 23 mg/dL 15  Creatinine Latest Ref Range: 0.40 - 1.20 mg/dL 0.95  Calcium Latest Ref Range: 8.4 - 10.5 mg/dL 12.0 (H)  Phosphorus Latest Ref Range: 2.3 - 4.6 mg/dL 1.7 (L)  Magnesium Latest  Ref Range: 1.5 - 2.5 mg/dL 2.1  Alkaline Phosphatase Latest Ref Range: 39 - 117 U/L 97  Albumin Latest Ref Range: 3.5 - 5.2 g/dL 4.4  AST Latest Ref Range: 0 - 37 U/L 26  ALT Latest Ref Range: 0 - 35 U/L 30  Total Protein Latest Ref Range: 6.0 - 8.3 g/dL 7.0  Total Bilirubin Latest Ref Range: 0.2 - 1.2 mg/dL 1.0  GFR Latest Ref Range: >60.00 mL/min 59.37 (L)  VITD Latest Ref Range: 30.00 - 100.00 ng/mL 48.57   Results for Katherine Jennings, Katherine Jennings (MRN [id number]) as of 01/18/2019 08:28  Ref. Range 01/17/2019 08:10  TSH Latest Ref Range: 0.35 - 4.50 uIU/mL 1.09    PTH- Pending  DXA ([facility]) 11/06/2018           T Score       Z-Score  RN     -2.2              -0.8 LN      -1.8               -  0.40  R total hip -1.3         -0.2 Left           -1.8          -0.7    AP Spine -1.7           +0.00   FRAX : MPO 18 % and 2.7 % at the hips   ASSESSMENT/PLAN/RECOMMENDATIONS:   1. Hypercalcemia   - Pt has mild symptoms  at this time. She is S/P parathyroidectomy (Right Superior and Inferior ) in 2009. Post-OP there was no evidence of normalization of hypercalcemia. She returns today with records revealing hypercalcemia since 2018.  - Repeat serum calcium on today's labs (Corrected) is 11.68 mg/dL (Non corrected 12.0)  - I don't have a recent PTH level , but this has been ordered, most likely (given prior history ) she has PTH- mediated hypercalcemia  - Will proceed with 24-hr urine collection for ca/Cr.   - In the meantime she was advised to stay hydrated  - Avoid OTC calcium  - She was instructed to consume 2-3 servings of calcium in her diet daily     2. Osteopenia :   - Continue Vitamin D2000 iu daily  - If the pt has Primary hyperparathyroidism, the mainstay of treatment is parathyroidectomy , will have to sort out her hypercalcemia first then will focus on her osteopenia.    F/U in 29months    Signed electronically by: Mack Guise, MD  [facility]  Endocrinology  [facility] [address] [place], [place] [address] Phone: [phone] FAX: [phone]   CC: Kelton Pillar, MD [place] [address] [place] [address] Phone: [phone] Fax: [phone]   Return to Endocrinology clinic as below: Future Appointments  Date Time Provider [place]  04/23/2019  7:50 AM Katherine Jennings, Katherine Crazier, MD [id number] None

## 2019-01-18 ENCOUNTER — Encounter: Payer: Self-pay | Admitting: Internal Medicine

## 2019-01-18 LAB — PTH, INTACT AND CALCIUM
Calcium: 12 mg/dL — ABNORMAL HIGH (ref 8.6–10.4)
PTH: 57 pg/mL (ref 14–64)

## 2019-01-22 ENCOUNTER — Other Ambulatory Visit: Payer: Self-pay

## 2019-01-22 ENCOUNTER — Other Ambulatory Visit: Payer: BC Managed Care – PPO

## 2019-01-22 ENCOUNTER — Other Ambulatory Visit: Payer: Self-pay | Admitting: Internal Medicine

## 2019-02-08 ENCOUNTER — Other Ambulatory Visit: Payer: Self-pay

## 2019-02-08 ENCOUNTER — Other Ambulatory Visit: Payer: BC Managed Care – PPO

## 2019-02-09 LAB — CALCIUM, URINE, 24 HOUR: Calcium, 24H Urine: 344 mg/24 h — ABNORMAL HIGH

## 2019-02-09 LAB — CREATININE, URINE, 24 HOUR: Creatinine, 24H Ur: 1.23 g/(24.h) (ref 0.50–2.15)

## 2019-02-13 ENCOUNTER — Encounter: Payer: Self-pay | Admitting: Internal Medicine

## 2019-04-19 ENCOUNTER — Other Ambulatory Visit: Payer: Self-pay

## 2019-04-23 ENCOUNTER — Ambulatory Visit
Admission: RE | Admit: 2019-04-23 | Discharge: 2019-04-23 | Disposition: A | Discharge status: Home / Self Care | Payer: BC Managed Care – PPO | Source: Ambulatory Visit | Attending: Internal Medicine | Admitting: Internal Medicine

## 2019-04-23 ENCOUNTER — Other Ambulatory Visit: Payer: Self-pay

## 2019-04-23 ENCOUNTER — Encounter: Payer: Self-pay | Admitting: Internal Medicine

## 2019-04-23 ENCOUNTER — Ambulatory Visit (INDEPENDENT_AMBULATORY_CARE_PROVIDER_SITE_OTHER): Payer: BC Managed Care – PPO | Admitting: Internal Medicine

## 2019-04-23 DIAGNOSIS — E210 Primary hyperparathyroidism: Secondary | ICD-10-CM | POA: Diagnosis not present

## 2019-04-23 DIAGNOSIS — M859 Disorder of bone density and structure, unspecified: Secondary | ICD-10-CM

## 2019-04-23 DIAGNOSIS — M8580 Other specified disorders of bone density and structure, unspecified site: Secondary | ICD-10-CM | POA: Diagnosis not present

## 2019-04-23 DIAGNOSIS — R198 Other specified symptoms and signs involving the digestive system and abdomen: Secondary | ICD-10-CM | POA: Diagnosis not present

## 2019-04-23 LAB — BASIC METABOLIC PANEL
BUN: 20 mg/dL (ref 6–23)
CO2: 25 mEq/L (ref 19–32)
Calcium: 11.8 mg/dL — ABNORMAL HIGH (ref 8.4–10.5)
Chloride: 104 mEq/L (ref 96–112)
Creatinine, Ser: 1.05 mg/dL (ref 0.40–1.20)
GFR: 52.85 mL/min — ABNORMAL LOW (ref >60.00)
Glucose, Bld: 96 mg/dL (ref 70–99)
Potassium: 4.5 mEq/L (ref 3.5–5.1)
Sodium: 137 mEq/L (ref 135–145)

## 2019-04-23 LAB — VITAMIN D 25 HYDROXY (VIT D DEFICIENCY, FRACTURES): VITD: 40.73 ng/mL (ref 30.00–100.00)

## 2019-04-23 LAB — ALBUMIN: Albumin: 4.3 g/dL (ref 3.5–5.2)

## 2019-04-23 NOTE — Progress Notes (Signed)
Name: Katherine Jennings  MRN/ DOB: [id number], Mar 14, 1956    Age/ Sex: 64 y.o., female     PCP: Kelton Pillar, MD   Reason for Endocrinology Evaluation: Osteopenia     Initial Endocrinology Clinic Visit: 01/18/2019    PATIENT IDENTIFIER: Katherine Jennings is a 64 y.o., female with a past medical history of hyperparathyroidism (S/P resection of the right superior and inferior 2009), GERD, Pancreatitis secondary to hypercalcemia and interstitial nephritis. She has followed with [facility] since 01/18/2019 for consultative assistance with management of her Osteopenia   HISTORICAL SUMMARY: [name] indicates that she was first diagnosed with hypercalcemia many years ago. She is S/P Right superior and inferior parathyroidectomy in 2009. Post-Op her hypercalcemia improved from 12.9 to 10.8 mg/dL . No records of calcium from 2009 until 2018, when again hypercalcemia was noted in 2018.   24-hr urine excretion of calcium 344 , with ca/cr ratio 0.0266 consistent with primary hyperparathyroidism  SUBJECTIVE:   During last visit (01/18/2019): We continued Vitamin D 2000 iu daily, encouraged hydration and proceeded with 24-hr urine collection   Today (04/23/2019):  [name] is here for a 3 month follow up on hypercalcemia. Pt denies any polyuria or polydipsia. She stays hydrated.  No other complaints.    ROS:  As per HPI.   HISTORY:  Past Medical History:  Past Medical History:  Diagnosis Date  . GERD (gastroesophageal reflux disease)   . H/O parathyroidectomy 2009  . History of pancreatitis    Secondary to Hypercalcemia    Past Surgical History:  Past Surgical History:  Procedure Laterality Date  . ABDOMINAL HYSTERECTOMY    . BREAST ENHANCEMENT SURGERY      Social History:  reports that she has been smoking. She has never used smokeless tobacco. She reports current alcohol use. No history on file for drug. Family History:  Family History  Problem  Relation Age of Onset  . Throat cancer Mother   . Diabetes Mother      HOME MEDICATIONS: Allergies as of 04/23/2019      Reactions   Azithromycin Rash   Cephalexin Rash   Erythromycin Rash   Penicillins Rash   Sulfa Antibiotics Rash      Medication List       Accurate as of April 23, 2019  8:00 AM. If you have any questions, ask your nurse or doctor.        Elmiron 100 MG capsule Generic drug: pentosan polysulfate Take by mouth. 2 times daily   Naproxen Sodium 220 MG Caps Take by mouth.   pantoprazole 40 MG tablet Commonly known as: PROTONIX TK 1 T PO ONCE A DAY   Vitamin D3 25 MCG (1000 UT) Caps Take by mouth.         OBJECTIVE:   PHYSICAL EXAM: VS: BP 114/78 (BP Location: Left Arm, Patient Position: Sitting, Cuff Size: Normal)   Pulse 73   Temp 98.3 F (36.8 C)   Ht 5' 6"  (1.676 m)   Wt 175 lb 6.4 oz (79.6 kg)   SpO2 98%   BMI 28.31 kg/m    EXAM: General: Pt appears well and is in NAD  Eyes: External eye exam normal without stare, lid lag or exophthalmos.  EOM intact.    Ears, Nose, Throat: Hearing: Grossly intact bilaterally Dental: Good dentition  Throat: Clear without mass, erythema or exudate  Neck: General: Supple without adenopathy. Thyroid: Thyroid size normal.  No goiter or nodules appreciated.  Lungs:  Clear with good BS bilat with no rales, rhonchi, or wheezes  Heart: Auscultation: RRR.  Abdomen: Normoactive bowel sounds, soft, nontender, without masses or organomegaly palpable  Extremities: Gait and station: Normal gait  Digits and nails: No clubbing, cyanosis, petechiae, or nodes Head and neck: Normal alignment and mobility BL UE: Normal ROM and strength. BL LE: No pretibial edema normal ROM and strength.  Skin: Hair: Texture and amount normal with gender appropriate distribution Skin Inspection: No rashes, acanthosis nigricans/skin tags. No lipohypertrophy Skin Palpation: Skin temperature, texture, and thickness normal to  palpation  Neuro: Cranial nerves: II - XII grossly intact  Cerebellar: Normal coordination and movement; no tremor Motor: Normal strength throughout DTRs: 2+ and symmetric in UE without delay in relaxation phase  Mental Status: Judgment, insight: Intact Orientation: Oriented to time, place, and person Memory: Intact for recent and remote events Mood and affect: No depression, anxiety, or agitation     DATA REVIEWED:  Results for ALVIA, JABLONSKI (MRN [id number]) as of 04/24/2019 15:01  Ref. Range 04/23/2019 08:21  Sodium Latest Ref Range: 135 - 145 mEq/L 137  Potassium Latest Ref Range: 3.5 - 5.1 mEq/L 4.5  Chloride Latest Ref Range: 96 - 112 mEq/L 104  CO2 Latest Ref Range: 19 - 32 mEq/L 25  Glucose Latest Ref Range: 70 - 99 mg/dL 96  BUN Latest Ref Range: 6 - 23 mg/dL 20  Creatinine Latest Ref Range: 0.40 - 1.20 mg/dL 1.05  Calcium Latest Ref Range: 8.4 - 10.5 mg/dL 11.8 (H)  Albumin Latest Ref Range: 3.5 - 5.2 g/dL 4.3  GFR Latest Ref Range: >60.00 mL/min 52.85 (L)  VITD Latest Ref Range: 30.00 - 100.00 ng/mL 40.73  PTH, Intact Latest Ref Range: 14 - 64 pg/mL 43       Results for HEAVEN, MEEKER (MRN [id number]) as of 04/24/2019 15:01  Ref. Range 02/08/2019 07:51  Calcium, 24H Urine Latest Units: mg/24 h 344 (H)  Creatinine, 24H Ur Latest Ref Range: 0.50 - 2.15 g/24 h 1.23    DXA ([facility]) 11/06/2018           T Score       Z-Score  RN     -2.2              -0.8 LN      -1.8               -0.40  R total hip -1.3         -0.2 Left           -1.8          -0.7    AP Spine -1.7           +0.00   FRAX : MPO 18 % and 2.7 % at the hips   ASSESSMENT / PLAN / RECOMMENDATIONS:   1. Hypercalcemia Secondary to Primary Hyperparathyroidism:   - Pt is S/P right super and inferior parathyroidectomy in 2009. Pt with recurrent hypercalcemia in 2018.  - Pt with inappropriately normal PTH level .  - Ca/cr ratio 0.0266 consistent with primary  hyperparathyroidism  - Pt meets criteria for surgical resection of parathyroid adenoma due to Serum calcium concentration of 1.0 mg/dL or more above the upper limit of normal and an estimated glomerular filtration rate (eGFR) <60 mL/min, as well as a Twenty-four-hour urinary calcium >300 mg/day .   - Encouraged hydration  - AVOID CALCIUM SUPPLEMENTS, AVOID LOW CALCIUM DIET - Maintain normal dietary calcium intake (  2-3 servings of dairy a day) - Continue Vitamin D3 2000 iu daily    2. Osteopenia :   - Continue Vitamin D2000 iu daily  - If the pt has Primary hyperparathyroidism, the mainstay of treatment is parathyroidectomy , will have to sort out her hypercalcemia first then will focus on her osteopenia.   F/U in 4 months or sooner if needed   Addendum: Discussed results with the pt on 04/24/2019 .    Signed electronically by: Mack Guise, MD  [facility] Endocrinology  [facility] [address] [place], [place] [address] Phone: [phone] FAX: [phone]      CC: Kelton Pillar, MD [place] [address] [place] [address] Phone: [phone]  Fax: [phone]   Return to Endocrinology clinic as below: No future appointments.

## 2019-04-23 NOTE — Patient Instructions (Signed)
-   Please stay hydrated - Avoid over the counter calcium products - Please make sure you are consuming 2-3 servings of calcium in your diet daily     - We will proceed with referral to Dr. Marlou Starks once your labs are back

## 2019-04-24 ENCOUNTER — Telehealth: Payer: Self-pay | Admitting: Internal Medicine

## 2019-04-25 LAB — PARATHYROID HORMONE, INTACT (NO CA): PTH: 43 pg/mL (ref 14–64)

## 2019-04-25 LAB — CALCITONIN: Calcitonin: <2 pg/mL (ref <=5)

## 2019-04-25 NOTE — Telephone Encounter (Signed)
order

## 2019-06-04 ENCOUNTER — Ambulatory Visit: Payer: BC Managed Care – PPO

## 2019-06-07 ENCOUNTER — Ambulatory Visit: Payer: BC Managed Care – PPO | Attending: Internal Medicine

## 2019-06-07 DIAGNOSIS — Z20822 Contact with and (suspected) exposure to covid-19: Secondary | ICD-10-CM | POA: Diagnosis not present

## 2019-06-09 LAB — NOVEL CORONAVIRUS, NAA: SARS-CoV-2, NAA: NOT DETECTED

## 2019-06-10 ENCOUNTER — Other Ambulatory Visit: Payer: BC Managed Care – PPO

## 2019-06-10 ENCOUNTER — Telehealth: Payer: Self-pay

## 2019-06-10 NOTE — Telephone Encounter (Signed)
Called [facility] to schedule this patient for 07/02/19 at 9:10 since none of the faxes are going through to their office-I left patient a VM making her aware of this appointment

## 2019-07-24 DIAGNOSIS — E210 Primary hyperparathyroidism: Secondary | ICD-10-CM | POA: Diagnosis not present

## 2019-07-24 DIAGNOSIS — M8580 Other specified disorders of bone density and structure, unspecified site: Secondary | ICD-10-CM | POA: Diagnosis not present

## 2019-07-25 ENCOUNTER — Other Ambulatory Visit: Payer: Self-pay | Admitting: Surgery

## 2019-07-25 DIAGNOSIS — E210 Primary hyperparathyroidism: Secondary | ICD-10-CM

## 2019-08-01 DIAGNOSIS — L609 Nail disorder, unspecified: Secondary | ICD-10-CM | POA: Diagnosis not present

## 2019-08-01 DIAGNOSIS — M205X1 Other deformities of toe(s) (acquired), right foot: Secondary | ICD-10-CM | POA: Diagnosis not present

## 2019-08-01 DIAGNOSIS — M205X2 Other deformities of toe(s) (acquired), left foot: Secondary | ICD-10-CM | POA: Diagnosis not present

## 2019-08-02 DIAGNOSIS — H353131 Nonexudative age-related macular degeneration, bilateral, early dry stage: Secondary | ICD-10-CM | POA: Diagnosis not present

## 2019-08-09 ENCOUNTER — Other Ambulatory Visit: Payer: Self-pay

## 2019-08-09 ENCOUNTER — Encounter (HOSPITAL_COMMUNITY)
Admission: RE | Admit: 2019-08-09 | Discharge: 2019-08-09 | Disposition: A | Discharge status: Home / Self Care | Payer: BC Managed Care – PPO | Source: Ambulatory Visit | Attending: Surgery | Admitting: Surgery

## 2019-08-09 DIAGNOSIS — E042 Nontoxic multinodular goiter: Secondary | ICD-10-CM | POA: Diagnosis not present

## 2019-08-09 DIAGNOSIS — E210 Primary hyperparathyroidism: Secondary | ICD-10-CM | POA: Insufficient documentation

## 2019-08-09 MED ORDER — TECHNETIUM TC 99M SESTAMIBI - CARDIOLITE
26.3000 | Freq: Once | INTRAVENOUS | Status: AC | PRN
  Administered 2019-08-09: 26.3 via INTRAVENOUS

## 2019-08-26 ENCOUNTER — Other Ambulatory Visit: Payer: Self-pay | Admitting: Surgery

## 2019-08-26 DIAGNOSIS — E210 Primary hyperparathyroidism: Secondary | ICD-10-CM

## 2019-09-17 ENCOUNTER — Ambulatory Visit
Admission: RE | Admit: 2019-09-17 | Discharge: 2019-09-17 | Disposition: A | Discharge status: Home / Self Care | Payer: BC Managed Care – PPO | Source: Ambulatory Visit | Attending: Surgery | Admitting: Surgery

## 2019-09-17 DIAGNOSIS — E210 Primary hyperparathyroidism: Secondary | ICD-10-CM

## 2019-09-17 MED ORDER — IOPAMIDOL (ISOVUE-300) INJECTION 61%
75.0000 mL | Freq: Once | INTRAVENOUS | Status: AC | PRN
  Administered 2019-09-17: 75 mL via INTRAVENOUS

## 2019-09-30 ENCOUNTER — Ambulatory Visit: Payer: Self-pay | Admitting: Surgery

## 2019-10-04 NOTE — Patient Instructions (Addendum)
DUE TO COVID-19 ONLY ONE VISITOR IS ALLOWED TO COME WITH YOU AND STAY IN THE WAITING ROOM ONLY DURING PRE OP AND PROCEDURE DAY OF SURGERY. THE 1 VISITOR MAY VISIT WITH YOU AFTER SURGERY IN YOUR PRIVATE ROOM DURING VISITING HOURS ONLY!  YOU NEED TO HAVE A COVID 19 TEST ON_7/6/21______ @_11 :30______, THIS TEST MUST BE DONE BEFORE SURGERY, COME  [address] [place] , [address].  ([place]) ONCE YOUR COVID TEST IS COMPLETED, PLEASE BEGIN THE QUARANTINE INSTRUCTIONS AS OUTLINED IN YOUR HANDOUT.                SHERIKA KUBICKI   Your procedure is scheduled on: 10/18/19   Report to [facility] Main  Entrance   Report to admitting at  8:00 AM     Call this number if you have problems the morning of surgery [phone]    Remember: Do not eat food after Midnight.  You may have clear liquids until 7:00  AM    CLEAR LIQUID DIET   Foods Allowed                                                                     Foods Excluded  Coffee and tea, regular and decaf                             liquids that you cannot  Plain Jell-O any favor except red or purple                                           see through such as: Fruit ices (not with fruit pulp)                                     milk, soups, orange juice  Iced Popsicles                                    All solid food Carbonated beverages, regular and diet                                    Cranberry, grape and apple juices Sports drinks like Gatorade Lightly seasoned clear broth or consume(fat free) Sugar, honey syrup      BRUSH YOUR TEETH MORNING OF SURGERY AND RINSE YOUR MOUTH OUT, NO CHEWING GUM CANDY OR MINTS.     Take these medicines the morning of surgery with A SIP OF WATER: Protonix                                 You may not have any metal on your body including hair pins and              piercings  Do not wear jewelry, make-up, lotions, powders or  perfumes, deodorant              Do not wear nail polish on your fingernails.  Do not shave  48 hours prior to surgery.             .   Do not bring valuables to the hospital. [place].  Contacts, dentures or bridgework may not be worn into surgery.       Patients discharged the day of surgery will not be allowed to drive home.   IF YOU ARE HAVING SURGERY AND GOING HOME THE SAME DAY, YOU MUST HAVE AN ADULT TO DRIVE YOU HOME AND BE WITH YOU FOR 24 HOURS.   YOU MAY GO HOME BY TAXI OR UBER OR ORTHERWISE, BUT AN ADULT MUST ACCOMPANY YOU HOME AND STAY WITH YOU FOR 24 HOURS.  Name and phone number of your driver:  Special Instructions: N/A              Please read over the following fact sheets you were given: _____________________________________________________________________             [facility] - Preparing for Surgery Before surgery, you can play an important role.   Because skin is not sterile, your skin needs to be as free of germs as possible.   You can reduce the number of germs on your skin by washing with CHG (chlorahexidine gluconate) soap before surgery.   CHG is an antiseptic cleaner which kills germs and bonds with the skin to continue killing germs even after washing. Please DO NOT use if you have an allergy to CHG or antibacterial soaps.   If your skin becomes reddened/irritated stop using the CHG and inform your nurse when you arrive at Short Stay. Do not shave (including legs and underarms) for at least 48 hours prior to the first CHG shower.    Please follow these instructions carefully:  1.  Shower with CHG Soap the night before surgery and the  morning of Surgery.  2.  If you choose to wash your hair, wash your hair first as usual with your  normal  shampoo.  3.  After you shampoo, rinse your hair and body thoroughly to remove the  shampoo.                                        4.  Use CHG as you would any other liquid soap.  You can apply  chg directly  to the skin and wash                       Gently with a scrungie or clean washcloth.  5.  Apply the CHG Soap to your body ONLY FROM THE NECK DOWN.   Do not use on face/ open                           Wound or open sores. Avoid contact with eyes, ears mouth and genitals (private parts).                       Wash face,  Genitals (private parts) with your normal soap.  6.  Wash thoroughly, paying special attention to the area where your surgery  will be performed.  7.  Thoroughly rinse your body with warm water from the neck down.  8.  DO NOT shower/wash with your normal soap after using and rinsing off  the CHG Soap.             9.  Pat yourself dry with a clean towel.            10.  Wear clean pajamas.            11.  Place clean sheets on your bed the night of your first shower and do not  sleep with pets. Day of Surgery : Do not apply any lotions/deodorants the morning of surgery.  Please wear clean clothes to the hospital/surgery center.  FAILURE TO FOLLOW THESE INSTRUCTIONS MAY RESULT IN THE CANCELLATION OF YOUR SURGERY PATIENT SIGNATURE_________________________________  NURSE SIGNATURE__________________________________  ________________________________________________________________________

## 2019-10-08 ENCOUNTER — Other Ambulatory Visit: Payer: Self-pay

## 2019-10-08 ENCOUNTER — Encounter (HOSPITAL_COMMUNITY)
Admission: RE | Admit: 2019-10-08 | Discharge: 2019-10-08 | Disposition: A | Discharge status: Home / Self Care | Payer: BC Managed Care – PPO | Source: Ambulatory Visit | Attending: Surgery | Admitting: Surgery

## 2019-10-08 ENCOUNTER — Encounter (HOSPITAL_COMMUNITY): Payer: Self-pay

## 2019-10-08 DIAGNOSIS — Z01812 Encounter for preprocedural laboratory examination: Secondary | ICD-10-CM | POA: Insufficient documentation

## 2019-10-08 LAB — CBC
HCT: 40.6 % (ref 36.0–46.0)
Hemoglobin: 13.5 g/dL (ref 12.0–15.0)
MCH: 32.6 pg (ref 26.0–34.0)
MCHC: 33.3 g/dL (ref 30.0–36.0)
MCV: 98.1 fL (ref 80.0–100.0)
Platelets: 334 10*3/uL (ref 150–400)
RBC: 4.14 MIL/uL (ref 3.87–5.11)
RDW: 13.1 % (ref 11.5–15.5)
WBC: 8.9 10*3/uL (ref 4.0–10.5)
nRBC: 0 % (ref 0.0–0.2)

## 2019-10-08 NOTE — Progress Notes (Signed)
COVID Vaccine Completed:yes Date COVID Vaccine completed:07/31/19 COVID vaccine manufacturer:   Moderna     PCP - Dr. Lady Deutscher Cardiologist -no  Chest x-ray - no EKG - no Stress Test - no ECHO - no Cardiac Cath - no  Sleep Study - Yes -negative findings CPAP - no  Fasting Blood Sugar - NA Checks Blood Sugar _____ times a day  Blood Thinner Instructions:NA Aspirin Instructions: Last Dose:  Anesthesia review:   Patient denies shortness of breath, fever, cough and chest pain at PAT appointment yes   Patient verbalized understanding of instructions that were given to them at the PAT appointment. Patient was also instructed that they will need to review over the PAT instructions again at home before surgery. Yes  Pt is in goo physical condition and is active . She has no SOB with ADLs.

## 2019-10-14 ENCOUNTER — Encounter (HOSPITAL_COMMUNITY): Payer: Self-pay | Admitting: Surgery

## 2019-10-14 DIAGNOSIS — E210 Primary hyperparathyroidism: Secondary | ICD-10-CM | POA: Diagnosis present

## 2019-10-14 NOTE — H&P (Signed)
General Surgery [facility]  Katherine Jennings DOB: 04-25-1955 Married / Language: English / Race: White Female   History of Present Illness   The patient is a 64 year old female who presents with primary hyperparathyroidism.  CHIEF COMPLAINT: primary hyperparathyroidism, persistent  Patient is referred by Dr. Kelton Pillar and Dr. Vivia Ewing for surgical evaluation and management of persistent primary hyperparathyroidism. Patient has a history of neck exploration and parathyroidectomy in September 2009 by Dr. Autumn Messing. I actually assisted on this procedure. Patient was found to have enlarged parathyroid glands on the right side in both the superior and inferior positions. Both glands were removed at the time of surgery. Patient experienced a marked fall in intact PTH level from 286 down to levels within the normal range. However, her elevated calcium level of 12.9 never completely returned to normal. Patient is now referred for further evaluation and recommendations. Recent calcium levels have remained elevated between 11.8 and 12.0. Intact PTH level has been in the upper normal range, ranging from 43-57. Vitamin D level is normal at 40.73. 24-hour urine collection for calcium is elevated at 344. Patient has not had any recent imaging studies. She has had no other neck surgery except for her parathyroidectomy in 2009. Patient has noted chronic fatigue. She does have hip pain. She does have osteopenia. Prior to her initial parathyroid surgery, the patient had episodes of pancreatitis. She has had no recurrent pancreatitis. There is no family history of parathyroid disease or other endocrine neoplasm. Patient works as an Writer.   Past Surgical History  Breast Augmentation  Bilateral. Breast Biopsy  Left. Colon Polyp Removal - Colonoscopy  Foot Surgery  Bilateral. Hysterectomy (due to cancer) - Partial  Hysterectomy (not due to  cancer) - Partial  Oral Surgery   Diagnostic Studies History  Colonoscopy  1-5 years ago Mammogram  within last year  Allergies  Azithromycin *CHEMICALS*  Cephalexin *CEPHALOSPORINS*  Erythromycin Derivatives  Penicillins  Sulfa Drugs  Allergies Reconciled   Medication History  Pantoprazole Sodium (40MG  Tablet DR, Oral) Active. Naproxen (250MG  Tablet, Oral) Active. Vitamin D3 (50 MCG(2000 UT) Tablet, Oral) Active. Medications Reconciled  Social History  Alcohol use  Occasional alcohol use. Caffeine use  Carbonated beverages, Coffee. No drug use  Tobacco use  Former smoker.  Family History  Alcohol Abuse  Mother, Sister. Arthritis  Mother. Colon Polyps  Mother, Sister. Depression  Sister. Heart Disease  Mother, Sister. Heart disease in female family member before age 38  Migraine Headache  Sister.  Pregnancy / Birth History Age at menarche  5 years. Age of menopause  <45 Gravida  2 Maternal age  86-20 Para  2  Other Problems  Arthritis  Bladder Problems  Gastroesophageal Reflux Disease  Pancreatitis   Review of Systems  General Not Present- Appetite Loss, Chills, Fatigue, Fever, Night Sweats, Weight Gain and Weight Loss. Skin Not Present- Change in Wart/Mole, Dryness, Hives, Jaundice, New Lesions, Non-Healing Wounds, Rash and Ulcer. HEENT Present- Seasonal Allergies and Wears glasses/contact lenses. Not Present- Earache, Hearing Loss, Hoarseness, Nose Bleed, Oral Ulcers, Ringing in the Ears, Sinus Pain, Sore Throat, Visual Disturbances and Yellow Eyes. Respiratory Present- Snoring. Not Present- Bloody sputum, Chronic Cough, Difficulty Breathing and Wheezing. Breast Not Present- Breast Mass, Breast Pain, Nipple Discharge and Skin Changes. Gastrointestinal Present- Bloating and Indigestion. Not Present- Abdominal Pain, Bloody Stool, Change in Bowel Habits, Chronic diarrhea, Constipation, Difficulty Swallowing, Excessive gas, Gets  full quickly at meals, Hemorrhoids, Nausea, Rectal  Pain and Vomiting. Female Genitourinary Present- Frequency and Urgency. Not Present- Nocturia, Painful Urination and Pelvic Pain. Musculoskeletal Present- Joint Pain. Not Present- Back Pain, Joint Stiffness, Muscle Pain, Muscle Weakness and Swelling of Extremities. Neurological Not Present- Decreased Memory, Fainting, Headaches, Numbness, Seizures, Tingling, Tremor, Trouble walking and Weakness. Psychiatric Not Present- Anxiety, Bipolar, Change in Sleep Pattern, Depression, Fearful and Frequent crying. Endocrine Not Present- Cold Intolerance, Excessive Hunger, Hair Changes, Heat Intolerance, Hot flashes and New Diabetes. Hematology Not Present- Blood Thinners, Easy Bruising, Excessive bleeding, Gland problems, HIV and Persistent Infections.  Vitals  Weight: 172.2 lb Height: 66in Body Surface Area: 1.88 m Body Mass Index: 27.79 kg/m  Temp.: 97.83F  Pulse: 84 (Regular)  BP: 124/80(Sitting, Left Arm, Standard)  Physical Exam   GENERAL APPEARANCE Development: normal Nutritional status: normal Gross deformities: none  SKIN Rash, lesions, ulcers: none Induration, erythema: none Nodules: none palpable  EYES Conjunctiva and lids: normal Pupils: equal and reactive Iris: normal bilaterally  EARS, NOSE, MOUTH, THROAT External ears: no lesion or deformity External nose: no lesion or deformity Hearing: grossly normal Due to Covid-19 pandemic, patient is wearing a mask.  NECK Symmetric: yes Trachea: midline Thyroid: no palpable nodules in the thyroid bed Well-healed right anterior cervical incision with good cosmetic result.  CHEST Respiratory effort: normal Retraction or accessory muscle use: no Breath sounds: normal bilaterally Rales, rhonchi, wheeze: none  CARDIOVASCULAR Auscultation: regular rhythm, normal rate Murmurs: none Pulses: radial pulse 2+ palpable Lower extremity edema:  none  MUSCULOSKELETAL Station and gait: normal Digits and nails: no clubbing or cyanosis Muscle strength: grossly normal all extremities Range of motion: grossly normal all extremities Deformity: none  LYMPHATIC Cervical: none palpable Supraclavicular: none palpable  PSYCHIATRIC Oriented to person, place, and time: yes Mood and affect: normal for situation Judgment and insight: appropriate for situation    Assessment & Plan   PRIMARY HYPERPARATHYROIDISM (E21.0) OSTEOPENIA (M85.80)  Follow Up - Call [facility] office after tests / studies doneto discuss further plans  Patient is referred by her primary care physician and her endocrinologist for surgical evaluation of persistent primary hyperparathyroidism.  Patient provided with a copy of "Parathyroid Surgery: Treatment for Your Parathyroid Gland Problem", published by [employer], 12 pages. Book reviewed and explained to patient during visit today.  Patient has a history of parathyroidectomy in 2009 when both of the right sided parathyroid glands were removed. Since that time she has had no further episodes of pancreatitis. PTH level improved but calcium level remained elevated. Patient has not had further evaluation until recently. Patient does have complications including chronic fatigue, bone and joint pain, and osteopenia.  I would like to proceed with nuclear medicine parathyroid scan and ultrasound examination of the neck. It is quite possible that the patient has another parathyroid adenoma on the left side. Patient does have persistent hypercalcemia with PTH levels that are not appropriately suppressed. We discussed potential surgical options including minimally invasive parathyroidectomy versus traditional neck exploration with parathyroidectomy.  Patient will undergo the above studies. We will contact her with the results. If these studies do not reveal the expected findings, we will consider proceeding with a 4D CT scan  of the neck for further evaluation. The patient and I discussed this plan today and she agrees to proceed.  ADDENDUM  Telephone call to patient with CT scan results - positive for left sided adenoma measuring 50mm.  Plan neck exploration for parathyroidectomy. Given prior surgery, plan overnight stay to check calcium levels the next morning.  The  risks and benefits of the procedure have been discussed at length with the patient. The patient understands the proposed procedure, potential alternative treatments, and the course of recovery to be expected. All of the patient's questions have been answered at this time. The patient wishes to proceed with surgery.  Armandina Gemma, MD [facility] Office: [phone]

## 2019-10-15 ENCOUNTER — Other Ambulatory Visit (HOSPITAL_COMMUNITY)
Admission: RE | Admit: 2019-10-15 | Discharge: 2019-10-15 | Disposition: A | Discharge status: Home / Self Care | Payer: BC Managed Care – PPO | Source: Ambulatory Visit | Attending: Surgery | Admitting: Surgery

## 2019-10-15 DIAGNOSIS — Z01812 Encounter for preprocedural laboratory examination: Secondary | ICD-10-CM | POA: Diagnosis not present

## 2019-10-15 DIAGNOSIS — Z20822 Contact with and (suspected) exposure to covid-19: Secondary | ICD-10-CM | POA: Diagnosis not present

## 2019-10-15 LAB — SARS CORONAVIRUS 2 (TAT 6-24 HRS): SARS Coronavirus 2: NEGATIVE

## 2019-10-18 ENCOUNTER — Ambulatory Visit (HOSPITAL_COMMUNITY): Payer: BC Managed Care – PPO | Admitting: Certified Registered"

## 2019-10-18 ENCOUNTER — Encounter (HOSPITAL_COMMUNITY)
Admission: RE | Disposition: A | Discharge status: Home / Self Care | Payer: Self-pay | Source: Home / Self Care | Attending: Surgery

## 2019-10-18 ENCOUNTER — Encounter (HOSPITAL_COMMUNITY): Payer: Self-pay | Admitting: Surgery

## 2019-10-18 ENCOUNTER — Other Ambulatory Visit: Payer: Self-pay

## 2019-10-18 ENCOUNTER — Ambulatory Visit (HOSPITAL_COMMUNITY)
Admission: RE | Admit: 2019-10-18 | Discharge: 2019-10-19 | Disposition: A | Discharge status: Home / Self Care | Payer: BC Managed Care – PPO | Attending: Surgery | Admitting: Surgery

## 2019-10-18 DIAGNOSIS — Z880 Allergy status to penicillin: Secondary | ICD-10-CM | POA: Diagnosis not present

## 2019-10-18 DIAGNOSIS — Z8261 Family history of arthritis: Secondary | ICD-10-CM | POA: Diagnosis not present

## 2019-10-18 DIAGNOSIS — Z8249 Family history of ischemic heart disease and other diseases of the circulatory system: Secondary | ICD-10-CM | POA: Diagnosis not present

## 2019-10-18 DIAGNOSIS — Z87891 Personal history of nicotine dependence: Secondary | ICD-10-CM | POA: Diagnosis not present

## 2019-10-18 DIAGNOSIS — D351 Benign neoplasm of parathyroid gland: Secondary | ICD-10-CM | POA: Insufficient documentation

## 2019-10-18 DIAGNOSIS — Z800 Family history of malignant neoplasm of digestive organs: Secondary | ICD-10-CM | POA: Insufficient documentation

## 2019-10-18 DIAGNOSIS — M8580 Other specified disorders of bone density and structure, unspecified site: Secondary | ICD-10-CM | POA: Insufficient documentation

## 2019-10-18 DIAGNOSIS — E210 Primary hyperparathyroidism: Secondary | ICD-10-CM | POA: Diagnosis not present

## 2019-10-18 DIAGNOSIS — Z90711 Acquired absence of uterus with remaining cervical stump: Secondary | ICD-10-CM | POA: Diagnosis not present

## 2019-10-18 DIAGNOSIS — Z882 Allergy status to sulfonamides status: Secondary | ICD-10-CM | POA: Insufficient documentation

## 2019-10-18 DIAGNOSIS — K219 Gastro-esophageal reflux disease without esophagitis: Secondary | ICD-10-CM | POA: Insufficient documentation

## 2019-10-18 DIAGNOSIS — Z881 Allergy status to other antibiotic agents status: Secondary | ICD-10-CM | POA: Insufficient documentation

## 2019-10-18 DIAGNOSIS — M1990 Unspecified osteoarthritis, unspecified site: Secondary | ICD-10-CM | POA: Insufficient documentation

## 2019-10-18 HISTORY — PX: PARATHYROIDECTOMY: SHX19

## 2019-10-18 SURGERY — PARATHYROIDECTOMY
Anesthesia: General

## 2019-10-18 MED ORDER — LACTATED RINGERS IV SOLN: INTRAVENOUS | Status: DC

## 2019-10-18 MED ORDER — SODIUM CHLORIDE 0.45 % IV SOLN
INTRAVENOUS | Status: DC
  Administered 2019-10-18: 50 mL/h via INTRAVENOUS

## 2019-10-18 MED ORDER — OXYCODONE HCL 5 MG/5ML PO SOLN: 5.0000 mg | Freq: Once | ORAL | Status: DC | PRN

## 2019-10-18 MED ORDER — MIDAZOLAM HCL 2 MG/2ML IJ SOLN
INTRAMUSCULAR | Status: AC
  Filled 2019-10-18: qty 2

## 2019-10-18 MED ORDER — ROCURONIUM BROMIDE 10 MG/ML (PF) SYRINGE
PREFILLED_SYRINGE | INTRAVENOUS | Status: DC | PRN
  Administered 2019-10-18: 50 mg via INTRAVENOUS
  Administered 2019-10-18: 10 mg via INTRAVENOUS

## 2019-10-18 MED ORDER — CIPROFLOXACIN IN D5W 400 MG/200ML IV SOLN
400.0000 mg | INTRAVENOUS | Status: AC
  Administered 2019-10-18: 400 mg via INTRAVENOUS
  Filled 2019-10-18: qty 200

## 2019-10-18 MED ORDER — OXYCODONE HCL 5 MG PO TABS
5.0000 mg | ORAL_TABLET | ORAL | Status: DC | PRN
  Administered 2019-10-18 (×2): 5 mg via ORAL
  Filled 2019-10-18 (×2): qty 1

## 2019-10-18 MED ORDER — MEPERIDINE HCL 50 MG/ML IJ SOLN: 6.2500 mg | INTRAMUSCULAR | Status: DC | PRN

## 2019-10-18 MED ORDER — TRAMADOL HCL 50 MG PO TABS: 50.0000 mg | ORAL_TABLET | Freq: Four times a day (QID) | ORAL | 0 refills | Status: AC | PRN

## 2019-10-18 MED ORDER — DEXMEDETOMIDINE HCL IN NACL 400 MCG/100ML IV SOLN
INTRAVENOUS | Status: DC | PRN
Start: 2019-10-18 — End: 2019-10-18
  Administered 2019-10-18: 4 ug via INTRAVENOUS
  Administered 2019-10-18 (×2): 8 ug via INTRAVENOUS

## 2019-10-18 MED ORDER — PROPOFOL 10 MG/ML IV BOLUS
INTRAVENOUS | Status: AC
  Filled 2019-10-18: qty 20

## 2019-10-18 MED ORDER — HYDROMORPHONE HCL 1 MG/ML IJ SOLN: 0.2500 mg | INTRAMUSCULAR | Status: DC | PRN

## 2019-10-18 MED ORDER — TRAMADOL HCL 50 MG PO TABS
50.0000 mg | ORAL_TABLET | Freq: Four times a day (QID) | ORAL | Status: DC | PRN
  Administered 2019-10-18: 50 mg via ORAL
  Filled 2019-10-18: qty 1

## 2019-10-18 MED ORDER — FENTANYL CITRATE (PF) 100 MCG/2ML IJ SOLN
INTRAMUSCULAR | Status: AC
  Filled 2019-10-18: qty 2

## 2019-10-18 MED ORDER — CHLORHEXIDINE GLUCONATE CLOTH 2 % EX PADS: 6.0000 | MEDICATED_PAD | Freq: Once | CUTANEOUS | Status: DC

## 2019-10-18 MED ORDER — DEXAMETHASONE SODIUM PHOSPHATE 10 MG/ML IJ SOLN
INTRAMUSCULAR | Status: DC | PRN
  Administered 2019-10-18: 10 mg via INTRAVENOUS

## 2019-10-18 MED ORDER — ACETAMINOPHEN 325 MG PO TABS
650.0000 mg | ORAL_TABLET | Freq: Four times a day (QID) | ORAL | Status: DC | PRN
  Administered 2019-10-18: 650 mg via ORAL
  Filled 2019-10-18: qty 2

## 2019-10-18 MED ORDER — PROPOFOL 10 MG/ML IV BOLUS
INTRAVENOUS | Status: DC | PRN
  Administered 2019-10-18: 160 mg via INTRAVENOUS

## 2019-10-18 MED ORDER — ONDANSETRON 4 MG PO TBDP: 4.0000 mg | ORAL_TABLET | Freq: Four times a day (QID) | ORAL | Status: DC | PRN

## 2019-10-18 MED ORDER — HYDROMORPHONE HCL 1 MG/ML IJ SOLN: 1.0000 mg | INTRAMUSCULAR | Status: DC | PRN

## 2019-10-18 MED ORDER — OXYCODONE HCL 5 MG PO TABS: 5.0000 mg | ORAL_TABLET | Freq: Once | ORAL | Status: DC | PRN

## 2019-10-18 MED ORDER — ONDANSETRON HCL 4 MG/2ML IJ SOLN
INTRAMUSCULAR | Status: DC | PRN
  Administered 2019-10-18: 4 mg via INTRAVENOUS

## 2019-10-18 MED ORDER — PROMETHAZINE HCL 25 MG/ML IJ SOLN: 6.2500 mg | INTRAMUSCULAR | Status: DC | PRN

## 2019-10-18 MED ORDER — ORAL CARE MOUTH RINSE: 15.0000 mL | Freq: Once | OROMUCOSAL | Status: AC

## 2019-10-18 MED ORDER — PHENYLEPHRINE 40 MCG/ML (10ML) SYRINGE FOR IV PUSH (FOR BLOOD PRESSURE SUPPORT)
PREFILLED_SYRINGE | INTRAVENOUS | Status: DC | PRN
  Administered 2019-10-18: 100 ug via INTRAVENOUS
  Administered 2019-10-18: 60 ug via INTRAVENOUS

## 2019-10-18 MED ORDER — ROCURONIUM BROMIDE 10 MG/ML (PF) SYRINGE
PREFILLED_SYRINGE | INTRAVENOUS | Status: AC
  Filled 2019-10-18: qty 10

## 2019-10-18 MED ORDER — BUPIVACAINE HCL 0.25 % IJ SOLN
INTRAMUSCULAR | Status: AC
  Filled 2019-10-18: qty 1

## 2019-10-18 MED ORDER — BUPIVACAINE HCL 0.25 % IJ SOLN
INTRAMUSCULAR | Status: DC | PRN
  Administered 2019-10-18: 10 mL

## 2019-10-18 MED ORDER — ONDANSETRON HCL 4 MG/2ML IJ SOLN
4.0000 mg | Freq: Four times a day (QID) | INTRAMUSCULAR | Status: DC | PRN
  Filled 2019-10-18: qty 2

## 2019-10-18 MED ORDER — MIDAZOLAM HCL 5 MG/5ML IJ SOLN
INTRAMUSCULAR | Status: DC | PRN
  Administered 2019-10-18: 2 mg via INTRAVENOUS

## 2019-10-18 MED ORDER — ACETAMINOPHEN 650 MG RE SUPP: 650.0000 mg | Freq: Four times a day (QID) | RECTAL | Status: DC | PRN

## 2019-10-18 MED ORDER — DEXAMETHASONE SODIUM PHOSPHATE 10 MG/ML IJ SOLN
INTRAMUSCULAR | Status: AC
  Filled 2019-10-18: qty 1

## 2019-10-18 MED ORDER — LIDOCAINE 2% (20 MG/ML) 5 ML SYRINGE
INTRAMUSCULAR | Status: AC
  Filled 2019-10-18: qty 5

## 2019-10-18 MED ORDER — SUGAMMADEX SODIUM 500 MG/5ML IV SOLN
INTRAVENOUS | Status: DC | PRN
  Administered 2019-10-18: 200 mg via INTRAVENOUS

## 2019-10-18 MED ORDER — AMISULPRIDE (ANTIEMETIC) 5 MG/2ML IV SOLN: 10.0000 mg | Freq: Once | INTRAVENOUS | Status: DC | PRN

## 2019-10-18 MED ORDER — FENTANYL CITRATE (PF) 100 MCG/2ML IJ SOLN
INTRAMUSCULAR | Status: DC | PRN
  Administered 2019-10-18: 100 ug via INTRAVENOUS
  Administered 2019-10-18 (×2): 50 ug via INTRAVENOUS

## 2019-10-18 MED ORDER — CHLORHEXIDINE GLUCONATE 0.12 % MT SOLN
15.0000 mL | Freq: Once | OROMUCOSAL | Status: AC
  Administered 2019-10-18: 15 mL via OROMUCOSAL

## 2019-10-18 MED ORDER — 0.9 % SODIUM CHLORIDE (POUR BTL) OPTIME
TOPICAL | Status: DC | PRN
  Administered 2019-10-18: 1000 mL

## 2019-10-18 MED ORDER — ONDANSETRON HCL 4 MG/2ML IJ SOLN
INTRAMUSCULAR | Status: AC
  Filled 2019-10-18: qty 2

## 2019-10-18 SURGICAL SUPPLY — 32 items
ADH SKN CLS APL DERMABOND .7 (GAUZE/BANDAGES/DRESSINGS) ×1
APL PRP STRL LF DISP 70% ISPRP (MISCELLANEOUS) ×1
ATTRACTOMAT 16X20 MAGNETIC DRP (DRAPES) ×2 IMPLANT
BLADE SURG 15 STRL LF DISP TIS (BLADE) ×1 IMPLANT
BLADE SURG 15 STRL SS (BLADE) ×2
CHLORAPREP W/TINT 26 (MISCELLANEOUS) ×2 IMPLANT
CLIP VESOCCLUDE MED 6/CT (CLIP) ×4 IMPLANT
CLIP VESOCCLUDE SM WIDE 6/CT (CLIP) ×4 IMPLANT
COVER SURGICAL LIGHT HANDLE (MISCELLANEOUS) ×2 IMPLANT
COVER WAND RF STERILE (DRAPES) ×2 IMPLANT
DERMABOND ADVANCED (GAUZE/BANDAGES/DRESSINGS) ×1
DERMABOND ADVANCED .7 DNX12 (GAUZE/BANDAGES/DRESSINGS) ×1 IMPLANT
DRAPE LAPAROTOMY T 98X78 PEDS (DRAPES) ×2 IMPLANT
ELECT REM PT RETURN 15FT ADLT (MISCELLANEOUS) ×2 IMPLANT
GAUZE 4X4 16PLY RFD (DISPOSABLE) ×2 IMPLANT
GLOVE SURG ORTHO 8.0 STRL STRW (GLOVE) ×2 IMPLANT
GOWN STRL REUS W/TWL XL LVL3 (GOWN DISPOSABLE) ×6 IMPLANT
HEMOSTAT SURGICEL 2X4 FIBR (HEMOSTASIS) ×2 IMPLANT
ILLUMINATOR WAVEGUIDE N/F (MISCELLANEOUS) IMPLANT
KIT BASIN (CUSTOM PROCEDURE TRAY) ×2 IMPLANT
KIT TURNOVER KIT A (KITS) IMPLANT
NDL HYPO 25X1 1.5 SAFETY (NEEDLE) ×1 IMPLANT
NEEDLE HYPO 25X1 1.5 SAFETY (NEEDLE) ×2 IMPLANT
PACK BASIC VI WITH GOWN DISP (CUSTOM PROCEDURE TRAY) ×2 IMPLANT
PENCIL SMOKE EVACUATOR (MISCELLANEOUS) ×2 IMPLANT
SUT MNCRL AB 4-0 PS2 18 (SUTURE) ×2 IMPLANT
SUT VIC AB 3-0 SH 18 (SUTURE) ×2 IMPLANT
SYR BULB IRRIG 60ML STRL (SYRINGE) ×2 IMPLANT
SYR CONTROL 10ML LL (SYRINGE) ×2 IMPLANT
TOWEL OR 17X26 10 PK STRL BLUE (TOWEL DISPOSABLE) ×2 IMPLANT
TOWEL OR NON WOVEN STRL DISP B (DISPOSABLE) ×2 IMPLANT
TUBING CONNECTING 10 (TUBING) ×2 IMPLANT

## 2019-10-18 NOTE — Op Note (Signed)
OPERATIVE REPORT - PARATHYROIDECTOMY  Preoperative diagnosis: Primary hyperparathyroidism  Postop diagnosis: Same  Procedure: Neck exploration with minimally invasive left superior parathyroidectomy  Surgeon:  Armandina Gemma, MD  Anesthesia: General endotracheal  Estimated blood loss: Minimal  Preparation: ChloraPrep  Indications: Patient is referred by Dr. Kelton Pillar and Dr. Vivia Ewing for surgical evaluation and management of persistent primary hyperparathyroidism. Patient has a history of neck exploration and parathyroidectomy in September 2009 by Dr. Autumn Messing. I actually assisted on this procedure. Patient was found to have enlarged parathyroid glands on the right side in both the superior and inferior positions. Both glands were removed at the time of surgery. Patient experienced a marked fall in intact PTH level from 286 down to levels within the normal range. However, her elevated calcium level of 12.9 never completely returned to normal. Patient is now referred for further evaluation and recommendations. Recent calcium levels have remained elevated between 11.8 and 12.0. Intact PTH level has been in the upper normal range, ranging from 43-57. Vitamin D level is normal at 40.73. 24-hour urine collection for calcium is elevated at 344. Patient has not had any recent imaging studies. She has had no other neck surgery except for her parathyroidectomy in 2009. Patient has noted chronic fatigue. She does have hip pain. She does have osteopenia. Prior to her initial parathyroid surgery, the patient had episodes of pancreatitis. She has had no recurrent pancreatitis. 4D-CT scan localizes a left sided adenoma.  Patient now comes to surgery for exploration and parathyroidectomy.  Procedure: The patient was prepared in the pre-operative holding area. The patient was brought to the operating room and placed in a supine position on the operating room table. Following  administration of general anesthesia, the patient was positioned and then prepped and draped in the usual strict aseptic fashion. After ascertaining that an adequate level of anesthesia been achieved, a neck incision was made with a #15 blade. Dissection was carried through subcutaneous tissues and platysma. Hemostasis was obtained with the electrocautery. Skin flaps were developed circumferentially and a Weitlander retractor was placed for exposure.  Strap muscles were incised in the midline. Strap muscles were reflected lateralley exposing the thyroid lobe. With gentle blunt dissection the thyroid lobe was mobilized.  Dissection was carried through adipose tissue and an enlarged parathyroid gland was identified posterior to the superior pole of the left thyroid lobe. It was gently mobilized. Vascular structures were divided between small ligaclips. Care was taken to avoid the recurrent laryngeal nerve and the esophagus. The parathyroid gland was completely excised. It was submitted to pathology where frozen section confirmed parathyroid tissue consistent with adenoma.  Further exploration inferiorly on the left failed to identify parathyroid tissue, but there was no evidence of an enlarged gland in the normal anatomic locations.  Neck was irrigated with warm saline and good hemostasis was noted. Fibrillar was placed in the operative field. Strap muscles were approximated in the midline with interrupted 3-0 Vicryl sutures. Platysma was closed with interrupted 3-0 Vicryl sutures. Marcaine was infiltrated circumferentially. Skin was closed with a running 4-0 Monocryl subcuticular suture. Wound was washed and dried and Dermabond was applied. Patient was awakened from anesthesia and brought to the recovery room. The patient tolerated the procedure well.   Armandina Gemma, MD [facility] Office: [phone]

## 2019-10-18 NOTE — Anesthesia Postprocedure Evaluation (Signed)
Anesthesia Post Note  Patient: Katherine Jennings  Procedure(s) Performed: NECK EXPLORATION AND PARATHYROIDECTOMY (N/A )     Patient location during evaluation: PACU Anesthesia Type: General Level of consciousness: awake and alert Pain management: pain level controlled Vital Signs Assessment: post-procedure vital signs reviewed and stable Respiratory status: spontaneous breathing, nonlabored ventilation and respiratory function stable Cardiovascular status: blood pressure returned to baseline and stable Postop Assessment: no apparent nausea or vomiting Anesthetic complications: no   No complications documented.  Last Vitals:  Vitals:   10/18/19 1230 10/18/19 1255  BP: 116/72 125/75  Pulse: (!) 55 (!) 56  Resp: (!) 9 12  Temp: 36.6 C 36.6 C  SpO2: 100% 98%    Last Pain:  Vitals:   10/18/19 1255  TempSrc: Oral  PainSc: 0-No pain                 Lynda Rainwater

## 2019-10-18 NOTE — Discharge Instructions (Signed)
[facility]  THYROID & PARATHYROID SURGERY:  POST-OP INSTRUCTIONS  Always review your discharge instruction sheet from the facility where your surgery was performed.  A prescription for pain medication may be given to you upon discharge.  Take your pain medication as prescribed.  If narcotic pain medicine is not needed, then you may take acetaminophen (Tylenol) or ibuprofen (Advil) as needed.  Take your usually prescribed medications unless otherwise directed.  If you need a refill on your pain medication, please contact our office during regular business hours.  Prescriptions cannot be processed by our office after 5 pm or on weekends.  Start with a light diet upon arrival home, such as soup and crackers or toast.  Be sure to drink plenty of fluids daily.  Resume your normal diet the day after surgery.  Most patients will experience some swelling and bruising on the chest and neck area.  Ice packs will help.  Swelling and bruising can take several days to resolve.   It is common to experience some constipation after surgery.  Increasing fluid intake and taking a stool softener (Colace) will usually help or prevent this problem.  A mild laxative (Milk of Magnesia or Miralax) should be taken according to package directions if there has been no bowel movement after 48 hours.  You have steri-strips and a gauze dressing over your incision.  You may remove the gauze bandage on the second day after surgery, and you may shower at that time.  Leave your steri-strips (small skin tapes) in place directly over the incision.  These strips should remain on the skin for 5-7 days and then be removed.  You may get them wet in the shower and pat them dry.  You may resume regular (light) daily activities beginning the next day (such as daily self-care, walking, climbing stairs) gradually increasing activities as tolerated.  You may have sexual intercourse when it is comfortable.  Refrain from  any heavy lifting or straining until approved by your doctor.  You may drive when you no longer are taking prescription pain medication, you can comfortably wear a seatbelt, and you can safely maneuver your car and apply brakes.  You should see your doctor in the office for a follow-up appointment approximately three weeks after your surgery.  Make sure that you call for this appointment within a day or two after you arrive home to insure a convenient appointment time.  WHEN TO CALL YOUR DOCTOR: -- Fever greater than 101.5 -- Inability to urinate -- Nausea and/or vomiting - persistent -- Extreme swelling or bruising -- Continued bleeding from incision -- Increased pain, redness, or drainage from the incision -- Difficulty swallowing or breathing -- Muscle cramping or spasms -- Numbness or tingling in hands or around lips  The clinic staff is available to answer your questions during regular business hours.  Please don't hesitate to call and ask to speak to one of the nurses if you have concerns.  Montrel Donahoe, MD [facility] Office: [phone] 

## 2019-10-18 NOTE — Transfer of Care (Signed)
Immediate Anesthesia Transfer of Care Note  Patient: Katherine Jennings  Procedure(s) Performed: NECK EXPLORATION AND PARATHYROIDECTOMY (N/A )  Patient Location: PACU  Anesthesia Type:General  Level of Consciousness: awake, alert  and oriented  Airway & Oxygen Therapy: Patient Spontanous Breathing and Patient connected to nasal cannula oxygen  Post-op Assessment: Report given to RN and Post -op Vital signs reviewed and stable  Post vital signs: Reviewed and stable  Last Vitals:  Vitals Value Taken Time  BP 113/75 10/18/19 1139  Temp    Pulse 62 10/18/19 1140  Resp 9 10/18/19 1139  SpO2 98 % 10/18/19 1140  Vitals shown include unvalidated device data.  Last Pain:  Vitals:   10/18/19 0813  TempSrc:   PainSc: 0-No pain         Complications: No complications documented.

## 2019-10-18 NOTE — Anesthesia Preprocedure Evaluation (Signed)
Anesthesia Evaluation  Patient identified by MRN, date of birth, ID band Patient awake    Reviewed: Allergy & Precautions, NPO status , Patient's Chart, lab work & pertinent test results  Airway Mallampati: II  TM Distance: >3 FB Neck ROM: Full    Dental no notable dental hx.    Pulmonary neg pulmonary ROS, former smoker,    Pulmonary exam normal breath sounds clear to auscultation       Cardiovascular negative cardio ROS Normal cardiovascular exam Rhythm:Regular Rate:Normal     Neuro/Psych negative neurological ROS  negative psych ROS   GI/Hepatic Neg liver ROS, GERD  ,  Endo/Other  negative endocrine ROS  Renal/GU negative Renal ROS  negative genitourinary   Musculoskeletal negative musculoskeletal ROS (+)   Abdominal   Peds negative pediatric ROS (+)  Hematology negative hematology ROS (+)   Anesthesia Other Findings Hyperparathyroidism  Reproductive/Obstetrics negative OB ROS                             Anesthesia Physical Anesthesia Plan  ASA: II  Anesthesia Plan: General   Post-op Pain Management:    Induction: Intravenous  PONV Risk Score and Plan: 3 and Ondansetron, Dexamethasone, Midazolam and Treatment may vary due to age or medical condition  Airway Management Planned: Oral ETT  Additional Equipment:   Intra-op Plan:   Post-operative Plan: Extubation in OR  Informed Consent: I have reviewed the patients History and Physical, chart, labs and discussed the procedure including the risks, benefits and alternatives for the proposed anesthesia with the patient or authorized representative who has indicated his/her understanding and acceptance.     Dental advisory given  Plan Discussed with: CRNA  Anesthesia Plan Comments:         Anesthesia Quick Evaluation

## 2019-10-18 NOTE — Interval H&P Note (Signed)
History and Physical Interval Note:  10/18/2019 10:11 AM  Katherine Jennings  has presented today for surgery, with the diagnosis of PRIMARY HYPERPARATHYROIDISM.  The various methods of treatment have been discussed with the patient and family. After consideration of risks, benefits and other options for treatment, the patient has consented to    Procedure(s): NECK EXPLORATION AND PARATHYROIDECTOMY (N/A) as a surgical intervention.    The patient's history has been reviewed, patient examined, no change in status, stable for surgery.  I have reviewed the patient's chart and labs.  Questions were answered to the patient's satisfaction.    Armandina Gemma, MD [facility] Office: [place]

## 2019-10-18 NOTE — Anesthesia Procedure Notes (Signed)
Procedure Name: Intubation Date/Time: 10/18/2019 10:31 AM Performed by: Williford, Peggy D, CRNA Pre-anesthesia Checklist: Patient identified, Emergency Drugs available, Suction available and Patient being monitored Patient Re-evaluated:Patient Re-evaluated prior to induction Oxygen Delivery Method: Circle system utilized Preoxygenation: Pre-oxygenation with 100% oxygen Induction Type: IV induction Ventilation: Mask ventilation without difficulty Laryngoscope Size: Mac and 3 Grade View: Grade I Tube type: Oral Tube size: 7.0 mm Number of attempts: 1 Airway Equipment and Method: Stylet Placement Confirmation: ETT inserted through vocal cords under direct vision,  positive ETCO2 and breath sounds checked- equal and bilateral Secured at: 21 cm Tube secured with: Tape Dental Injury: Teeth and Oropharynx as per pre-operative assessment

## 2019-10-19 ENCOUNTER — Encounter (HOSPITAL_COMMUNITY): Payer: Self-pay | Admitting: Surgery

## 2019-10-19 DIAGNOSIS — M1990 Unspecified osteoarthritis, unspecified site: Secondary | ICD-10-CM | POA: Diagnosis not present

## 2019-10-19 DIAGNOSIS — Z800 Family history of malignant neoplasm of digestive organs: Secondary | ICD-10-CM | POA: Diagnosis not present

## 2019-10-19 DIAGNOSIS — Z880 Allergy status to penicillin: Secondary | ICD-10-CM | POA: Diagnosis not present

## 2019-10-19 DIAGNOSIS — Z8249 Family history of ischemic heart disease and other diseases of the circulatory system: Secondary | ICD-10-CM | POA: Diagnosis not present

## 2019-10-19 DIAGNOSIS — E210 Primary hyperparathyroidism: Secondary | ICD-10-CM | POA: Diagnosis not present

## 2019-10-19 DIAGNOSIS — Z90711 Acquired absence of uterus with remaining cervical stump: Secondary | ICD-10-CM | POA: Diagnosis not present

## 2019-10-19 DIAGNOSIS — K219 Gastro-esophageal reflux disease without esophagitis: Secondary | ICD-10-CM | POA: Diagnosis not present

## 2019-10-19 DIAGNOSIS — Z881 Allergy status to other antibiotic agents status: Secondary | ICD-10-CM | POA: Diagnosis not present

## 2019-10-19 DIAGNOSIS — Z882 Allergy status to sulfonamides status: Secondary | ICD-10-CM | POA: Diagnosis not present

## 2019-10-19 DIAGNOSIS — Z87891 Personal history of nicotine dependence: Secondary | ICD-10-CM | POA: Diagnosis not present

## 2019-10-19 DIAGNOSIS — D351 Benign neoplasm of parathyroid gland: Secondary | ICD-10-CM | POA: Diagnosis not present

## 2019-10-19 DIAGNOSIS — Z8261 Family history of arthritis: Secondary | ICD-10-CM | POA: Diagnosis not present

## 2019-10-19 DIAGNOSIS — M8580 Other specified disorders of bone density and structure, unspecified site: Secondary | ICD-10-CM | POA: Diagnosis not present

## 2019-10-19 LAB — BASIC METABOLIC PANEL
Anion gap: 5 (ref 5–15)
BUN: 18 mg/dL (ref 8–23)
CO2: 26 mmol/L (ref 22–32)
Calcium: 9.5 mg/dL (ref 8.9–10.3)
Chloride: 105 mmol/L (ref 98–111)
Creatinine, Ser: 0.87 mg/dL (ref 0.44–1.00)
GFR calc Af Amer: >60 mL/min (ref >60)
GFR calc non Af Amer: >60 mL/min (ref >60)
Glucose, Bld: 106 mg/dL — ABNORMAL HIGH (ref 70–99)
Potassium: 4.2 mmol/L (ref 3.5–5.1)
Sodium: 136 mmol/L (ref 135–145)

## 2019-10-19 NOTE — Progress Notes (Signed)
Patient ID: Katherine Jennings, female   DOB: November 28, 1955, 64 y.o.   MRN: [id number]   She looks good this morning. Pain is controlled Neck incision is clean Calcium is normal  Plan: Discharged home

## 2019-10-19 NOTE — Progress Notes (Signed)
Assessment unchanged. Pt verbalized understanding of dc instructions through teach back including medications, follow up care and when to call the doctor. Discharged via wc to front entrance accompanied by NT. 

## 2019-10-19 NOTE — Discharge Summary (Signed)
Physician Discharge Summary  Patient ID: Katherine Jennings MRN: [id number] DOB/AGE: 05/15/1955 64 y.o.  Admit date: 10/18/2019 Discharge date: 10/19/2019  Admission Diagnoses:  Discharge Diagnoses:  Principal Problem:   Hyperparathyroidism, primary [facility]) Active Problems:   Hypercalcemia   Primary hyperparathyroidism [facility])   Discharged Condition: good  Hospital Course: Uneventful postoperative recovery.  Patient discharged home postoperative day 1 with a normal calcium and doing well  Consults: None  Significant Diagnostic Studies:   Treatments: surgery: parathyroidectomy  Discharge Exam: Blood pressure 101/66, pulse 69, temperature 97.7 F (36.5 C), temperature source Oral, resp. rate 18, height 5\' 6"  (1.676 m), weight 78.5 kg, SpO2 100 %. General appearance: alert, cooperative and no distress Incision/Wound:incision clean, no hematoma, voice normal  Disposition: Discharge disposition: 01-Home or Self Care        Allergies as of 10/19/2019      Reactions   Azithromycin Rash   Cephalexin Rash   Erythromycin Rash   Penicillins Rash   Sulfa Antibiotics Rash      Medication List    TAKE these medications   acidophilus Caps capsule Take 1 capsule by mouth daily.   Elmiron 100 MG capsule Generic drug: pentosan polysulfate Take by mouth. 2 times daily   naproxen sodium 220 MG tablet Commonly known as: ALEVE Take 220 mg by mouth daily.   pantoprazole 40 MG tablet Commonly known as: PROTONIX Take 40 mg by mouth daily.   PRESERVISION AREDS 2 PO Take 1 capsule by mouth in the morning and at bedtime.   traMADol 50 MG tablet Commonly known as: ULTRAM Take 1-2 tablets (50-100 mg total) by mouth every 6 (six) hours as needed.   Vitamin D3 25 MCG (1000 UT) Caps Take by mouth.       Follow-up Information    Armandina Gemma, MD. Schedule an appointment as soon as possible for a visit in 3 weeks.   Specialty: General Surgery Why: For wound re-check Contact  information: [address] [place] [address] [phone]               Signed: Coralie Keens 10/19/2019, 9:40 AM

## 2019-10-21 LAB — SURGICAL PATHOLOGY

## 2019-10-30 DIAGNOSIS — Z0000 Encounter for general adult medical examination without abnormal findings: Secondary | ICD-10-CM | POA: Diagnosis not present

## 2019-10-30 DIAGNOSIS — Z13220 Encounter for screening for lipoid disorders: Secondary | ICD-10-CM | POA: Diagnosis not present

## 2019-10-30 DIAGNOSIS — K219 Gastro-esophageal reflux disease without esophagitis: Secondary | ICD-10-CM | POA: Diagnosis not present

## 2019-11-05 DIAGNOSIS — Z1231 Encounter for screening mammogram for malignant neoplasm of breast: Secondary | ICD-10-CM | POA: Diagnosis not present

## 2019-12-11 ENCOUNTER — Other Ambulatory Visit (HOSPITAL_COMMUNITY): Payer: Self-pay | Admitting: Gastroenterology

## 2019-12-24 DIAGNOSIS — R350 Frequency of micturition: Secondary | ICD-10-CM | POA: Diagnosis not present

## 2019-12-24 DIAGNOSIS — N3010 Interstitial cystitis (chronic) without hematuria: Secondary | ICD-10-CM | POA: Diagnosis not present

## 2019-12-27 DIAGNOSIS — E892 Postprocedural hypoparathyroidism: Secondary | ICD-10-CM | POA: Diagnosis not present

## 2019-12-27 DIAGNOSIS — E210 Primary hyperparathyroidism: Secondary | ICD-10-CM | POA: Diagnosis not present

## 2020-01-14 ENCOUNTER — Other Ambulatory Visit (HOSPITAL_COMMUNITY): Payer: Self-pay | Admitting: Gastroenterology

## 2020-02-03 DIAGNOSIS — Z1159 Encounter for screening for other viral diseases: Secondary | ICD-10-CM | POA: Diagnosis not present

## 2020-02-05 DIAGNOSIS — Z86010 Personal history of colonic polyps: Secondary | ICD-10-CM | POA: Diagnosis not present

## 2020-02-05 DIAGNOSIS — D123 Benign neoplasm of transverse colon: Secondary | ICD-10-CM | POA: Diagnosis not present

## 2020-08-24 DIAGNOSIS — H5203 Hypermetropia, bilateral: Secondary | ICD-10-CM | POA: Diagnosis not present

## 2020-08-24 DIAGNOSIS — H353131 Nonexudative age-related macular degeneration, bilateral, early dry stage: Secondary | ICD-10-CM | POA: Diagnosis not present

## 2020-10-06 IMAGING — CT CT NECK SOFT TISSUE WO/W CM
3 of 10 series · 8 of 33 positions shown, 9 images · IV contrast (iopamidol)
Comparison: Parathyroid scintigraphy and thyroid ultrasound
08/09/2019

CLINICAL DATA: Primary hyperparathyroidism. History of right
parathyroidectomy in 6558.

Creatinine was obtained on site at [HOSPITAL] at [REDACTED].
Results: Creatinine 0.9 mg/dL.
EXAM:
CT NECK WITH AND WITHOUT CONTRAST
TECHNIQUE: Multidetector CT imaging of the neck was performed without and with
intravenous contrast.
CONTRAST:  75mL [id number]-111 IOPAMIDOL (R0BFKS-111) INJECTION 61%

[Series 5: neck without 1.00 br40 s3 · axial · non-contrast · 0.47mm/px · z∈[-738,-658]mm · 2 of 240 slices shown, 3 images]
[im 80/240  soft-tissue]
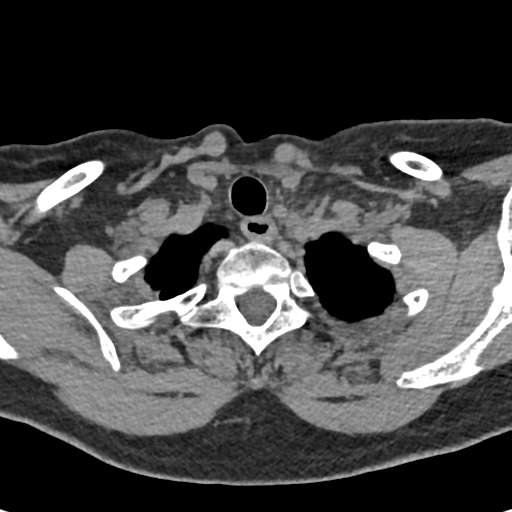
[im 80/240  bone]
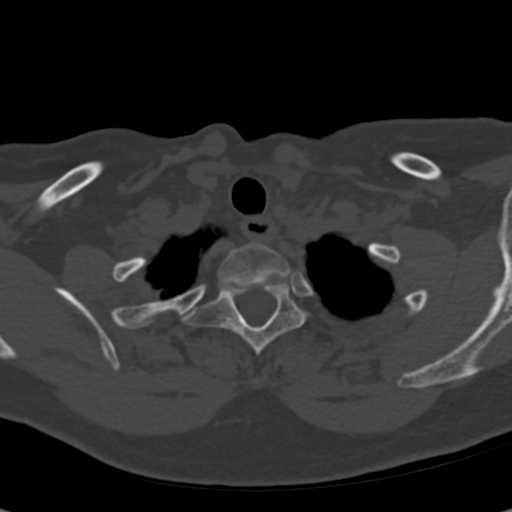
[im 160/240  bone]
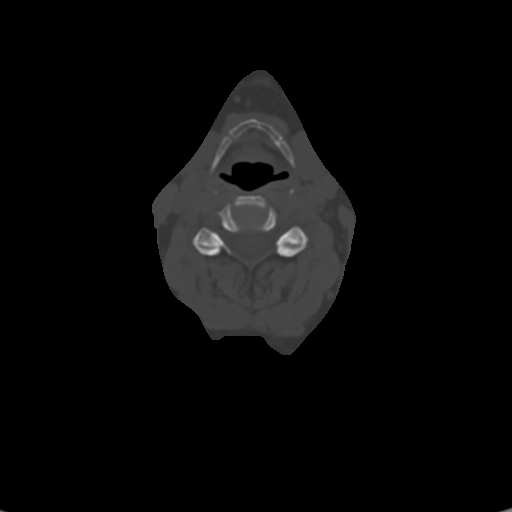

[Series 8: neck arterial 2.00 br40 s3 · coronal · arterial · 0.39mm/px · 1 of 98 slices shown (1 of 2)]
[im 49/98  bone]
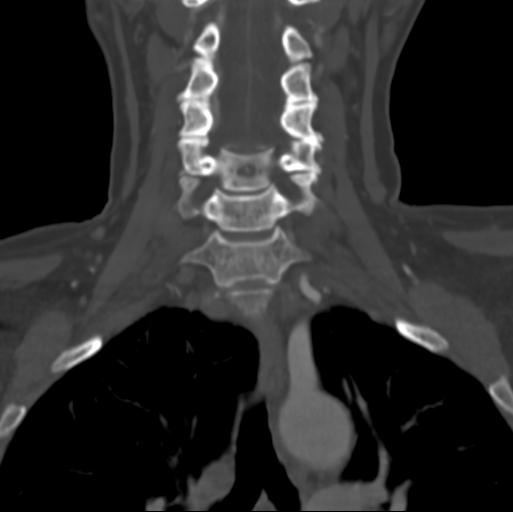

[Series 10: neck arterial 2.00 br40 s3 · sagittal · arterial · 0.38mm/px · 5 of 100 slices shown (2 of 2)]
[im 17/100  bone]
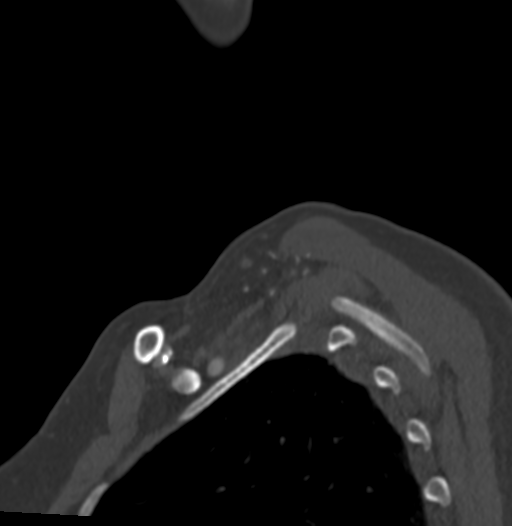
[im 34/100  bone]
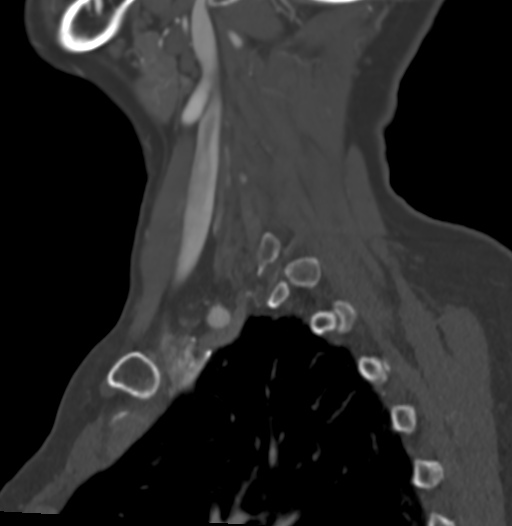
[im 50/100  bone]
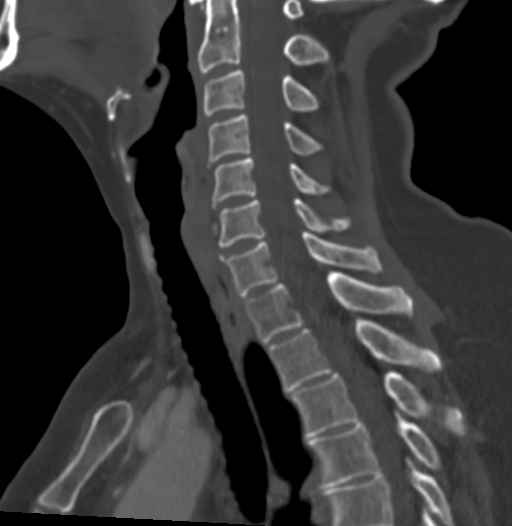
[im 67/100  bone]
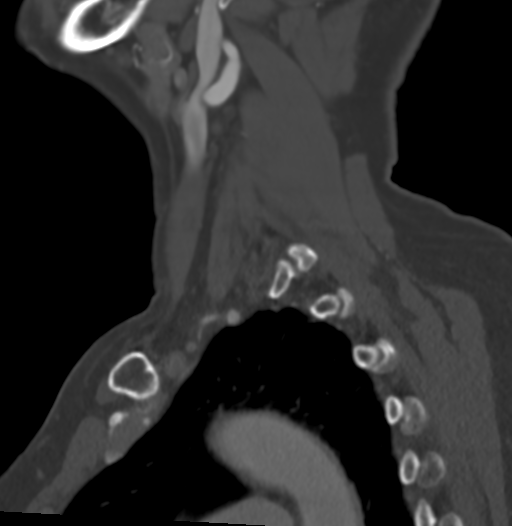
[im 83/100  bone]
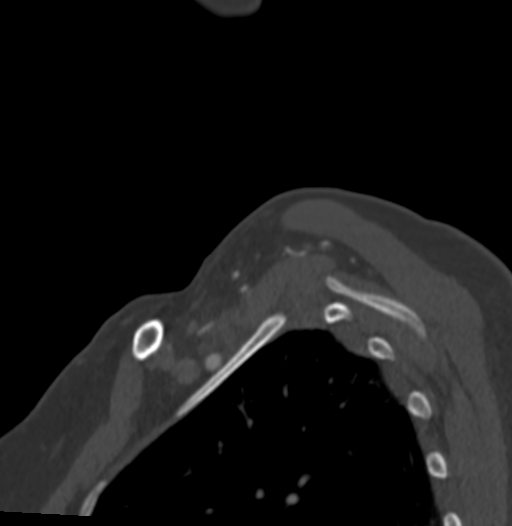

[8 of 33 positions shown; findings below may reference images not displayed]

FINDINGS: MULTIPHASE PARATHYROID PROTOCOL FINDINGS

Number of candidate lesions: 1

Candidate lesion: 1, best seen on axial series 12 image 96, coronal
series 8 image 40 and sagittal series 10 image 54.

Size (mm): 6 AP x 13 TV x 17 CC

Location: Posterior to the mid aspect of the left thyroid lobe,
lateral and posterior to the tracheoesophageal groove.

Polar vessel: Present

Enhancement pattern relative to normal thyroid parenchyma:

--Precontrast: Hypodense

--Arterial phase: Isodense

--Venous phase: Hypodense

CT NECK FINDINGS

Pharynx and larynx: No evidence of mass or swelling within the
included portion of the pharynx. Unremarkable larynx.

Salivary glands: Unremarkable appearance of the submandibular and
included portions of the parotid glands.

Thyroid: The thyroid itself is heterogeneous with small bilateral
nodules, not incidental as they were previously evaluated by
ultrasound.

Lymph nodes: Scattered subcentimeter/normal sized cervical lymph
nodes bilaterally 7 x 5 mm hypoenhancing nodule in the right
tracheoesophageal groove near the thoracic inlet also favored to
represent a lymph node.

Vascular: Major vascular structures of the neck are patent.

Skeleton: Mild cervical facet arthrosis.

Upper chest: Clear lung apices.

Other: None.
IMPRESSION: 17 mm candidate lesion posterior to the mid left thyroid lobe with
enhancement pattern and polar vessel highly suspicious for a
parathyroid adenoma.

## 2020-11-05 DIAGNOSIS — D126 Benign neoplasm of colon, unspecified: Secondary | ICD-10-CM | POA: Diagnosis not present

## 2020-11-05 DIAGNOSIS — M8588 Other specified disorders of bone density and structure, other site: Secondary | ICD-10-CM | POA: Diagnosis not present

## 2020-11-05 DIAGNOSIS — K219 Gastro-esophageal reflux disease without esophagitis: Secondary | ICD-10-CM | POA: Diagnosis not present

## 2020-11-05 DIAGNOSIS — N3011 Interstitial cystitis (chronic) with hematuria: Secondary | ICD-10-CM | POA: Diagnosis not present

## 2020-11-05 DIAGNOSIS — Z1159 Encounter for screening for other viral diseases: Secondary | ICD-10-CM | POA: Diagnosis not present

## 2020-11-05 DIAGNOSIS — Z23 Encounter for immunization: Secondary | ICD-10-CM | POA: Diagnosis not present

## 2020-11-05 DIAGNOSIS — Z0000 Encounter for general adult medical examination without abnormal findings: Secondary | ICD-10-CM | POA: Diagnosis not present

## 2020-11-05 DIAGNOSIS — D351 Benign neoplasm of parathyroid gland: Secondary | ICD-10-CM | POA: Diagnosis not present

## 2020-11-05 DIAGNOSIS — E7800 Pure hypercholesterolemia, unspecified: Secondary | ICD-10-CM | POA: Diagnosis not present

## 2020-11-10 DIAGNOSIS — Z1231 Encounter for screening mammogram for malignant neoplasm of breast: Secondary | ICD-10-CM | POA: Diagnosis not present

## 2021-01-01 DIAGNOSIS — M79645 Pain in left finger(s): Secondary | ICD-10-CM | POA: Diagnosis not present

## 2021-01-05 DIAGNOSIS — M25642 Stiffness of left hand, not elsewhere classified: Secondary | ICD-10-CM | POA: Diagnosis not present

## 2021-01-05 DIAGNOSIS — M79645 Pain in left finger(s): Secondary | ICD-10-CM | POA: Diagnosis not present

## 2021-01-05 DIAGNOSIS — M20022 Boutonniere deformity of left finger(s): Secondary | ICD-10-CM | POA: Diagnosis not present

## 2021-01-12 DIAGNOSIS — M25642 Stiffness of left hand, not elsewhere classified: Secondary | ICD-10-CM | POA: Diagnosis not present

## 2021-01-12 DIAGNOSIS — M79645 Pain in left finger(s): Secondary | ICD-10-CM | POA: Diagnosis not present

## 2021-01-12 DIAGNOSIS — M20022 Boutonniere deformity of left finger(s): Secondary | ICD-10-CM | POA: Diagnosis not present

## 2021-01-26 DIAGNOSIS — M79645 Pain in left finger(s): Secondary | ICD-10-CM | POA: Diagnosis not present

## 2021-01-26 DIAGNOSIS — M25642 Stiffness of left hand, not elsewhere classified: Secondary | ICD-10-CM | POA: Diagnosis not present

## 2021-01-26 DIAGNOSIS — M20022 Boutonniere deformity of left finger(s): Secondary | ICD-10-CM | POA: Diagnosis not present

## 2021-02-09 DIAGNOSIS — M25642 Stiffness of left hand, not elsewhere classified: Secondary | ICD-10-CM | POA: Diagnosis not present

## 2021-02-09 DIAGNOSIS — M79645 Pain in left finger(s): Secondary | ICD-10-CM | POA: Diagnosis not present

## 2021-02-09 DIAGNOSIS — M20022 Boutonniere deformity of left finger(s): Secondary | ICD-10-CM | POA: Diagnosis not present

## 2021-04-22 DIAGNOSIS — M79645 Pain in left finger(s): Secondary | ICD-10-CM | POA: Diagnosis not present

## 2021-04-22 DIAGNOSIS — M20022 Boutonniere deformity of left finger(s): Secondary | ICD-10-CM | POA: Diagnosis not present

## 2021-04-29 DIAGNOSIS — M25642 Stiffness of left hand, not elsewhere classified: Secondary | ICD-10-CM | POA: Diagnosis not present

## 2021-04-29 DIAGNOSIS — M79645 Pain in left finger(s): Secondary | ICD-10-CM | POA: Diagnosis not present

## 2021-04-29 DIAGNOSIS — M20022 Boutonniere deformity of left finger(s): Secondary | ICD-10-CM | POA: Diagnosis not present

## 2021-05-03 DIAGNOSIS — M20022 Boutonniere deformity of left finger(s): Secondary | ICD-10-CM | POA: Diagnosis not present

## 2021-05-03 DIAGNOSIS — M25642 Stiffness of left hand, not elsewhere classified: Secondary | ICD-10-CM | POA: Diagnosis not present

## 2021-05-03 DIAGNOSIS — M79645 Pain in left finger(s): Secondary | ICD-10-CM | POA: Diagnosis not present

## 2021-05-05 DIAGNOSIS — M25642 Stiffness of left hand, not elsewhere classified: Secondary | ICD-10-CM | POA: Diagnosis not present

## 2021-05-10 DIAGNOSIS — M79645 Pain in left finger(s): Secondary | ICD-10-CM | POA: Diagnosis not present

## 2021-05-10 DIAGNOSIS — M20022 Boutonniere deformity of left finger(s): Secondary | ICD-10-CM | POA: Diagnosis not present

## 2021-05-10 DIAGNOSIS — M25642 Stiffness of left hand, not elsewhere classified: Secondary | ICD-10-CM | POA: Diagnosis not present

## 2021-05-12 DIAGNOSIS — M20022 Boutonniere deformity of left finger(s): Secondary | ICD-10-CM | POA: Diagnosis not present

## 2021-05-12 DIAGNOSIS — M79645 Pain in left finger(s): Secondary | ICD-10-CM | POA: Diagnosis not present

## 2021-05-12 DIAGNOSIS — M25642 Stiffness of left hand, not elsewhere classified: Secondary | ICD-10-CM | POA: Diagnosis not present

## 2021-05-14 DIAGNOSIS — E7800 Pure hypercholesterolemia, unspecified: Secondary | ICD-10-CM | POA: Diagnosis not present

## 2021-05-17 DIAGNOSIS — M79645 Pain in left finger(s): Secondary | ICD-10-CM | POA: Diagnosis not present

## 2021-05-17 DIAGNOSIS — M20022 Boutonniere deformity of left finger(s): Secondary | ICD-10-CM | POA: Diagnosis not present

## 2021-05-17 DIAGNOSIS — M25642 Stiffness of left hand, not elsewhere classified: Secondary | ICD-10-CM | POA: Diagnosis not present

## 2021-05-19 DIAGNOSIS — M25642 Stiffness of left hand, not elsewhere classified: Secondary | ICD-10-CM | POA: Diagnosis not present

## 2021-05-19 DIAGNOSIS — M79645 Pain in left finger(s): Secondary | ICD-10-CM | POA: Diagnosis not present

## 2021-05-19 DIAGNOSIS — M20022 Boutonniere deformity of left finger(s): Secondary | ICD-10-CM | POA: Diagnosis not present

## 2021-05-24 DIAGNOSIS — M25642 Stiffness of left hand, not elsewhere classified: Secondary | ICD-10-CM | POA: Diagnosis not present

## 2021-05-24 DIAGNOSIS — M20022 Boutonniere deformity of left finger(s): Secondary | ICD-10-CM | POA: Diagnosis not present

## 2021-05-24 DIAGNOSIS — M79645 Pain in left finger(s): Secondary | ICD-10-CM | POA: Diagnosis not present

## 2021-05-26 DIAGNOSIS — M79645 Pain in left finger(s): Secondary | ICD-10-CM | POA: Diagnosis not present

## 2021-05-26 DIAGNOSIS — M20022 Boutonniere deformity of left finger(s): Secondary | ICD-10-CM | POA: Diagnosis not present

## 2021-05-26 DIAGNOSIS — M25642 Stiffness of left hand, not elsewhere classified: Secondary | ICD-10-CM | POA: Diagnosis not present

## 2021-05-31 DIAGNOSIS — M25642 Stiffness of left hand, not elsewhere classified: Secondary | ICD-10-CM | POA: Diagnosis not present

## 2021-05-31 DIAGNOSIS — M79645 Pain in left finger(s): Secondary | ICD-10-CM | POA: Diagnosis not present

## 2021-05-31 DIAGNOSIS — M20022 Boutonniere deformity of left finger(s): Secondary | ICD-10-CM | POA: Diagnosis not present

## 2021-09-10 ENCOUNTER — Other Ambulatory Visit (HOSPITAL_COMMUNITY): Payer: Self-pay

## 2021-09-14 DIAGNOSIS — Z0100 Encounter for examination of eyes and vision without abnormal findings: Secondary | ICD-10-CM | POA: Diagnosis not present

## 2021-09-15 DIAGNOSIS — H5203 Hypermetropia, bilateral: Secondary | ICD-10-CM | POA: Diagnosis not present

## 2021-11-08 DIAGNOSIS — E7800 Pure hypercholesterolemia, unspecified: Secondary | ICD-10-CM | POA: Diagnosis not present

## 2021-11-08 DIAGNOSIS — M8588 Other specified disorders of bone density and structure, other site: Secondary | ICD-10-CM | POA: Diagnosis not present

## 2021-11-08 DIAGNOSIS — K219 Gastro-esophageal reflux disease without esophagitis: Secondary | ICD-10-CM | POA: Diagnosis not present

## 2021-11-08 DIAGNOSIS — Z0000 Encounter for general adult medical examination without abnormal findings: Secondary | ICD-10-CM | POA: Diagnosis not present

## 2021-11-08 DIAGNOSIS — N3011 Interstitial cystitis (chronic) with hematuria: Secondary | ICD-10-CM | POA: Diagnosis not present

## 2021-11-11 DIAGNOSIS — Z1231 Encounter for screening mammogram for malignant neoplasm of breast: Secondary | ICD-10-CM | POA: Diagnosis not present

## 2021-11-11 DIAGNOSIS — Z780 Asymptomatic menopausal state: Secondary | ICD-10-CM | POA: Diagnosis not present

## 2021-11-11 DIAGNOSIS — M8589 Other specified disorders of bone density and structure, multiple sites: Secondary | ICD-10-CM | POA: Diagnosis not present

## 2021-11-17 DIAGNOSIS — Z791 Long term (current) use of non-steroidal anti-inflammatories (NSAID): Secondary | ICD-10-CM | POA: Diagnosis not present

## 2021-11-17 DIAGNOSIS — R32 Unspecified urinary incontinence: Secondary | ICD-10-CM | POA: Diagnosis not present

## 2021-11-17 DIAGNOSIS — R69 Illness, unspecified: Secondary | ICD-10-CM | POA: Diagnosis not present

## 2021-11-17 DIAGNOSIS — Z825 Family history of asthma and other chronic lower respiratory diseases: Secondary | ICD-10-CM | POA: Diagnosis not present

## 2021-11-17 DIAGNOSIS — K219 Gastro-esophageal reflux disease without esophagitis: Secondary | ICD-10-CM | POA: Diagnosis not present

## 2021-11-17 DIAGNOSIS — Z8249 Family history of ischemic heart disease and other diseases of the circulatory system: Secondary | ICD-10-CM | POA: Diagnosis not present

## 2021-11-17 DIAGNOSIS — Z833 Family history of diabetes mellitus: Secondary | ICD-10-CM | POA: Diagnosis not present

## 2021-11-17 DIAGNOSIS — Z87891 Personal history of nicotine dependence: Secondary | ICD-10-CM | POA: Diagnosis not present

## 2021-11-17 DIAGNOSIS — E785 Hyperlipidemia, unspecified: Secondary | ICD-10-CM | POA: Diagnosis not present

## 2021-11-17 DIAGNOSIS — Z880 Allergy status to penicillin: Secondary | ICD-10-CM | POA: Diagnosis not present

## 2021-11-17 DIAGNOSIS — M1990 Unspecified osteoarthritis, unspecified site: Secondary | ICD-10-CM | POA: Diagnosis not present

## 2022-02-07 DIAGNOSIS — Z23 Encounter for immunization: Secondary | ICD-10-CM | POA: Diagnosis not present

## 2022-09-14 DIAGNOSIS — N3010 Interstitial cystitis (chronic) without hematuria: Secondary | ICD-10-CM | POA: Diagnosis not present

## 2022-09-14 DIAGNOSIS — N3281 Overactive bladder: Secondary | ICD-10-CM | POA: Diagnosis not present

## 2022-09-14 DIAGNOSIS — R350 Frequency of micturition: Secondary | ICD-10-CM | POA: Diagnosis not present

## 2022-09-19 DIAGNOSIS — H5203 Hypermetropia, bilateral: Secondary | ICD-10-CM | POA: Diagnosis not present

## 2022-09-22 DIAGNOSIS — N3281 Overactive bladder: Secondary | ICD-10-CM | POA: Diagnosis not present

## 2022-09-22 DIAGNOSIS — N3010 Interstitial cystitis (chronic) without hematuria: Secondary | ICD-10-CM | POA: Diagnosis not present

## 2022-11-10 DIAGNOSIS — D351 Benign neoplasm of parathyroid gland: Secondary | ICD-10-CM | POA: Diagnosis not present

## 2022-11-10 DIAGNOSIS — E7800 Pure hypercholesterolemia, unspecified: Secondary | ICD-10-CM | POA: Diagnosis not present

## 2022-11-10 DIAGNOSIS — M8588 Other specified disorders of bone density and structure, other site: Secondary | ICD-10-CM | POA: Diagnosis not present

## 2022-11-10 DIAGNOSIS — K219 Gastro-esophageal reflux disease without esophagitis: Secondary | ICD-10-CM | POA: Diagnosis not present

## 2022-11-10 DIAGNOSIS — Z0000 Encounter for general adult medical examination without abnormal findings: Secondary | ICD-10-CM | POA: Diagnosis not present

## 2022-11-10 DIAGNOSIS — R635 Abnormal weight gain: Secondary | ICD-10-CM | POA: Diagnosis not present

## 2022-11-10 DIAGNOSIS — N3011 Interstitial cystitis (chronic) with hematuria: Secondary | ICD-10-CM | POA: Diagnosis not present

## 2022-11-28 DIAGNOSIS — Z1231 Encounter for screening mammogram for malignant neoplasm of breast: Secondary | ICD-10-CM | POA: Diagnosis not present

## 2023-03-22 DIAGNOSIS — J029 Acute pharyngitis, unspecified: Secondary | ICD-10-CM | POA: Diagnosis not present

## 2023-04-20 DIAGNOSIS — N390 Urinary tract infection, site not specified: Secondary | ICD-10-CM | POA: Diagnosis not present

## 2023-07-11 DIAGNOSIS — T85193A Other mechanical complication of implanted electronic neurostimulator, generator, initial encounter: Secondary | ICD-10-CM | POA: Diagnosis not present

## 2023-07-11 DIAGNOSIS — N3941 Urge incontinence: Secondary | ICD-10-CM | POA: Diagnosis not present

## 2023-07-11 DIAGNOSIS — T83110A Breakdown (mechanical) of urinary electronic stimulator device, initial encounter: Secondary | ICD-10-CM | POA: Diagnosis not present

## 2023-07-19 DIAGNOSIS — N3281 Overactive bladder: Secondary | ICD-10-CM | POA: Diagnosis not present

## 2023-10-19 DIAGNOSIS — H5203 Hypermetropia, bilateral: Secondary | ICD-10-CM | POA: Diagnosis not present

## 2023-11-13 ENCOUNTER — Other Ambulatory Visit (HOSPITAL_BASED_OUTPATIENT_CLINIC_OR_DEPARTMENT_OTHER): Payer: Self-pay | Admitting: Internal Medicine

## 2023-11-13 DIAGNOSIS — E7800 Pure hypercholesterolemia, unspecified: Secondary | ICD-10-CM | POA: Diagnosis not present

## 2023-11-13 DIAGNOSIS — M8588 Other specified disorders of bone density and structure, other site: Secondary | ICD-10-CM | POA: Diagnosis not present

## 2023-11-13 DIAGNOSIS — D351 Benign neoplasm of parathyroid gland: Secondary | ICD-10-CM | POA: Diagnosis not present

## 2023-11-13 DIAGNOSIS — Z136 Encounter for screening for cardiovascular disorders: Secondary | ICD-10-CM

## 2023-11-22 DIAGNOSIS — M25511 Pain in right shoulder: Secondary | ICD-10-CM | POA: Diagnosis not present

## 2023-11-29 ENCOUNTER — Ambulatory Visit (HOSPITAL_BASED_OUTPATIENT_CLINIC_OR_DEPARTMENT_OTHER)
Admission: RE | Admit: 2023-11-29 | Discharge: 2023-11-29 | Disposition: A | Discharge status: Home / Self Care | Payer: Self-pay | Source: Ambulatory Visit | Attending: Internal Medicine | Admitting: Internal Medicine

## 2023-11-29 DIAGNOSIS — Z136 Encounter for screening for cardiovascular disorders: Secondary | ICD-10-CM | POA: Insufficient documentation

## 2023-12-04 DIAGNOSIS — M7551 Bursitis of right shoulder: Secondary | ICD-10-CM | POA: Diagnosis not present

## 2023-12-08 DIAGNOSIS — Z1231 Encounter for screening mammogram for malignant neoplasm of breast: Secondary | ICD-10-CM | POA: Diagnosis not present

## 2023-12-08 DIAGNOSIS — M8589 Other specified disorders of bone density and structure, multiple sites: Secondary | ICD-10-CM | POA: Diagnosis not present

## 2023-12-20 DIAGNOSIS — R7303 Prediabetes: Secondary | ICD-10-CM | POA: Diagnosis not present

## 2023-12-20 DIAGNOSIS — Z6830 Body mass index (BMI) 30.0-30.9, adult: Secondary | ICD-10-CM | POA: Diagnosis not present

## 2023-12-20 DIAGNOSIS — E66811 Obesity, class 1: Secondary | ICD-10-CM | POA: Diagnosis not present

## 2023-12-20 DIAGNOSIS — E782 Mixed hyperlipidemia: Secondary | ICD-10-CM | POA: Diagnosis not present

## 2023-12-20 DIAGNOSIS — D351 Benign neoplasm of parathyroid gland: Secondary | ICD-10-CM | POA: Diagnosis not present

## 2023-12-20 DIAGNOSIS — R0683 Snoring: Secondary | ICD-10-CM | POA: Diagnosis not present

## 2023-12-20 DIAGNOSIS — F50819 Binge eating disorder, unspecified: Secondary | ICD-10-CM | POA: Diagnosis not present

## 2023-12-20 DIAGNOSIS — Z9189 Other specified personal risk factors, not elsewhere classified: Secondary | ICD-10-CM | POA: Diagnosis not present

## 2023-12-20 DIAGNOSIS — K219 Gastro-esophageal reflux disease without esophagitis: Secondary | ICD-10-CM | POA: Diagnosis not present

## 2023-12-20 DIAGNOSIS — R7309 Other abnormal glucose: Secondary | ICD-10-CM | POA: Diagnosis not present

## 2023-12-20 DIAGNOSIS — Z1331 Encounter for screening for depression: Secondary | ICD-10-CM | POA: Diagnosis not present

## 2023-12-20 DIAGNOSIS — Z79899 Other long term (current) drug therapy: Secondary | ICD-10-CM | POA: Diagnosis not present

## 2024-02-22 DIAGNOSIS — E782 Mixed hyperlipidemia: Secondary | ICD-10-CM | POA: Diagnosis not present

## 2024-02-22 DIAGNOSIS — F50819 Binge eating disorder, unspecified: Secondary | ICD-10-CM | POA: Diagnosis not present

## 2024-02-22 DIAGNOSIS — Z8719 Personal history of other diseases of the digestive system: Secondary | ICD-10-CM | POA: Diagnosis not present

## 2024-02-22 DIAGNOSIS — Z6829 Body mass index (BMI) 29.0-29.9, adult: Secondary | ICD-10-CM | POA: Diagnosis not present

## 2024-02-22 DIAGNOSIS — K219 Gastro-esophageal reflux disease without esophagitis: Secondary | ICD-10-CM | POA: Diagnosis not present

## 2024-02-22 DIAGNOSIS — E66811 Obesity, class 1: Secondary | ICD-10-CM | POA: Diagnosis not present

## 2024-02-22 DIAGNOSIS — R7303 Prediabetes: Secondary | ICD-10-CM | POA: Diagnosis not present

## 2024-02-22 DIAGNOSIS — D351 Benign neoplasm of parathyroid gland: Secondary | ICD-10-CM | POA: Diagnosis not present
